# Patient Record
Sex: Male | Born: 1935 | Race: White | Hispanic: No | Marital: Married | Smoking: Never smoker
Health system: Southern US, Community
[De-identification: ages and names within clinical notes are randomized; demographics above are authoritative.]

## PROBLEM LIST (undated history)

## (undated) DIAGNOSIS — E785 Hyperlipidemia, unspecified: Secondary | ICD-10-CM

## (undated) DIAGNOSIS — E119 Type 2 diabetes mellitus without complications: Secondary | ICD-10-CM

## (undated) DIAGNOSIS — N3941 Urge incontinence: Secondary | ICD-10-CM

## (undated) DIAGNOSIS — I10 Essential (primary) hypertension: Secondary | ICD-10-CM

## (undated) DIAGNOSIS — R12 Heartburn: Secondary | ICD-10-CM

## (undated) DIAGNOSIS — E039 Hypothyroidism, unspecified: Secondary | ICD-10-CM

## (undated) DIAGNOSIS — I219 Acute myocardial infarction, unspecified: Secondary | ICD-10-CM

## (undated) DIAGNOSIS — I509 Heart failure, unspecified: Secondary | ICD-10-CM

## (undated) DIAGNOSIS — I519 Heart disease, unspecified: Secondary | ICD-10-CM

## (undated) HISTORY — PX: CATARACT EXTRACTION, BILATERAL: SHX1313

## (undated) HISTORY — DX: Heart failure, unspecified: I50.9

## (undated) HISTORY — DX: Hypothyroidism, unspecified: E03.9

## (undated) HISTORY — DX: Type 2 diabetes mellitus without complications: E11.9

## (undated) HISTORY — DX: Hyperlipidemia, unspecified: E78.5

## (undated) HISTORY — DX: Urge incontinence: N39.41

## (undated) HISTORY — DX: Acute myocardial infarction, unspecified: I21.9

## (undated) HISTORY — DX: Heartburn: R12

## (undated) HISTORY — DX: Heart disease, unspecified: I51.9

## (undated) HISTORY — DX: Essential (primary) hypertension: I10

---

## 2009-11-05 HISTORY — PX: CORONARY ARTERY BYPASS GRAFT: SHX141

## 2010-10-10 ENCOUNTER — Ambulatory Visit: Payer: Self-pay | Admitting: Internal Medicine

## 2011-06-26 ENCOUNTER — Ambulatory Visit: Payer: Self-pay | Admitting: Internal Medicine

## 2011-07-20 ENCOUNTER — Ambulatory Visit: Payer: Self-pay | Admitting: Internal Medicine

## 2011-08-16 DIAGNOSIS — Z95810 Presence of automatic (implantable) cardiac defibrillator: Secondary | ICD-10-CM | POA: Insufficient documentation

## 2011-08-23 DIAGNOSIS — I2510 Atherosclerotic heart disease of native coronary artery without angina pectoris: Secondary | ICD-10-CM | POA: Insufficient documentation

## 2011-08-23 DIAGNOSIS — E7800 Pure hypercholesterolemia, unspecified: Secondary | ICD-10-CM | POA: Insufficient documentation

## 2011-08-23 DIAGNOSIS — I472 Ventricular tachycardia, unspecified: Secondary | ICD-10-CM | POA: Insufficient documentation

## 2011-08-23 DIAGNOSIS — I10 Essential (primary) hypertension: Secondary | ICD-10-CM | POA: Insufficient documentation

## 2011-10-11 DIAGNOSIS — I509 Heart failure, unspecified: Secondary | ICD-10-CM | POA: Insufficient documentation

## 2012-01-05 ENCOUNTER — Ambulatory Visit: Payer: Self-pay | Admitting: Internal Medicine

## 2012-01-24 DIAGNOSIS — E11319 Type 2 diabetes mellitus with unspecified diabetic retinopathy without macular edema: Secondary | ICD-10-CM | POA: Insufficient documentation

## 2012-01-24 DIAGNOSIS — E119 Type 2 diabetes mellitus without complications: Secondary | ICD-10-CM | POA: Insufficient documentation

## 2012-01-24 DIAGNOSIS — I340 Nonrheumatic mitral (valve) insufficiency: Secondary | ICD-10-CM | POA: Insufficient documentation

## 2012-06-05 DIAGNOSIS — Z79899 Other long term (current) drug therapy: Secondary | ICD-10-CM | POA: Insufficient documentation

## 2012-11-05 HISTORY — PX: CARDIAC DEFIBRILLATOR PLACEMENT: SHX171

## 2015-04-28 ENCOUNTER — Other Ambulatory Visit: Payer: Self-pay | Admitting: Internal Medicine

## 2015-06-29 ENCOUNTER — Ambulatory Visit: Payer: Self-pay | Admitting: Family Medicine

## 2015-06-30 ENCOUNTER — Ambulatory Visit (INDEPENDENT_AMBULATORY_CARE_PROVIDER_SITE_OTHER): Payer: Medicare Other | Admitting: Family Medicine

## 2015-06-30 ENCOUNTER — Encounter: Payer: Self-pay | Admitting: Family Medicine

## 2015-06-30 VITALS — BP 110/60 | HR 78 | Temp 97.8°F | Ht 74.0 in | Wt 186.4 lb

## 2015-06-30 DIAGNOSIS — R600 Localized edema: Secondary | ICD-10-CM | POA: Insufficient documentation

## 2015-06-30 DIAGNOSIS — I255 Ischemic cardiomyopathy: Secondary | ICD-10-CM | POA: Insufficient documentation

## 2015-06-30 DIAGNOSIS — B9789 Other viral agents as the cause of diseases classified elsewhere: Principal | ICD-10-CM

## 2015-06-30 DIAGNOSIS — E785 Hyperlipidemia, unspecified: Secondary | ICD-10-CM | POA: Insufficient documentation

## 2015-06-30 DIAGNOSIS — F5101 Primary insomnia: Secondary | ICD-10-CM | POA: Insufficient documentation

## 2015-06-30 DIAGNOSIS — J3089 Other allergic rhinitis: Secondary | ICD-10-CM | POA: Insufficient documentation

## 2015-06-30 DIAGNOSIS — I2510 Atherosclerotic heart disease of native coronary artery without angina pectoris: Secondary | ICD-10-CM | POA: Insufficient documentation

## 2015-06-30 DIAGNOSIS — K219 Gastro-esophageal reflux disease without esophagitis: Secondary | ICD-10-CM | POA: Insufficient documentation

## 2015-06-30 DIAGNOSIS — I5032 Chronic diastolic (congestive) heart failure: Secondary | ICD-10-CM | POA: Insufficient documentation

## 2015-06-30 DIAGNOSIS — Z87898 Personal history of other specified conditions: Secondary | ICD-10-CM | POA: Insufficient documentation

## 2015-06-30 DIAGNOSIS — J069 Acute upper respiratory infection, unspecified: Secondary | ICD-10-CM | POA: Diagnosis not present

## 2015-06-30 DIAGNOSIS — E039 Hypothyroidism, unspecified: Secondary | ICD-10-CM | POA: Insufficient documentation

## 2015-07-01 ENCOUNTER — Encounter: Payer: Self-pay | Admitting: Family Medicine

## 2015-07-01 NOTE — Progress Notes (Signed)
Date:  06/30/2015   Name:  Terrance Carrillo   DOB:  [birth year]   MRN:  [id number]  PCP:  Halina Maidens, MD    Chief Complaint: Cough   History of Present Illness:  This is a 81 y.o. male with MMP including CHF c/o 3-4 day hx NP cough, sl sore throat, occ rhinorrhea, sl B otalgia. Wife with similar sxs.  Review of Systems:  Review of Systems  Constitutional: Negative for fever.  Respiratory: Negative for shortness of breath and wheezing.   Cardiovascular: Negative for chest pain.    Patient Active Problem List   Diagnosis Date Noted  . Idiopathic insomnia 06/30/2015  . Local edema 06/30/2015  . Cardiomyopathy, ischemic 06/30/2015  . History of prolonged Q-T interval on ECG 06/30/2015  . Acid reflux 06/30/2015  . Allergic state 06/30/2015  . Dyslipidemia 06/30/2015  . CAD in native artery 06/30/2015  . Chronic diastolic heart failure 76/28/3151  . Acquired hypothyroidism 06/30/2015  . MI (mitral incompetence) 01/24/2012  . Diabetes 01/24/2012  . CCF (congestive cardiac failure) 10/11/2011  . Paroxysmal ventricular tachycardia 08/23/2011  . BP (high blood pressure) 08/23/2011  . Hypercholesteremia 08/23/2011  . Arteriosclerosis of coronary artery 08/23/2011  . Automatic implantable cardioverter-defibrillator in situ 08/16/2011    Prior to Admission medications   Medication Sig Start Date End Date Taking? Authorizing Provider  aspirin 81 MG tablet Take by mouth. 11/16/10  Yes Historical Provider, MD  furosemide (LASIX) 20 MG tablet Take 1 tablet by mouth daily at 2 PM daily at 2 PM.   Yes Historical Provider, MD  glimepiride (AMARYL) 2 MG tablet Take 1 tablet by mouth daily at 2 PM daily at 2 PM.   Yes Historical Provider, MD  levothyroxine (SYNTHROID, LEVOTHROID) 25 MCG tablet Take 1 tablet by mouth daily at 2 PM daily at 2 PM.   Yes Historical Provider, MD  lisinopril (PRINIVIL,ZESTRIL) 5 MG tablet Take 1 tablet by mouth daily at 2 PM daily at 2 PM. 09/27/14 09/27/15 Yes  Historical Provider, MD  metFORMIN (GLUCOPHAGE) 1000 MG tablet Take 1 tablet by mouth 2 (two) times daily. 11/16/10  Yes Historical Provider, MD  metoprolol (LOPRESSOR) 50 MG tablet Take 1 tablet by mouth 2 (two) times daily. 01/11/15  Yes Historical Provider, MD  Multiple Vitamin (MULTI-VITAMINS) TABS Take by mouth.   Yes Historical Provider, MD  potassium chloride SA (K-DUR,KLOR-CON) 20 MEQ tablet Take 1 tablet by mouth daily at 2 PM daily at 2 PM.   Yes Historical Provider, MD  simvastatin (ZOCOR) 40 MG tablet TAKE (1) TABLET BY MOUTH DAILY AT BEDTIME 04/28/15  Yes Glean Hess, MD  sotalol (BETAPACE) 120 MG tablet Take 1 tablet by mouth 2 (two) times daily. 03/23/15  Yes Historical Provider, MD    Allergies  Allergen Reactions  . Penicillins     No past surgical history on file.  Social History  Substance Use Topics  . Smoking status: Never Smoker   . Smokeless tobacco: Not on file  . Alcohol Use: No    No family history on file.  Medication list has been reviewed and updated.  Physical Examination: BP 110/60 mmHg  Pulse 78  Temp(Src) 97.8 F (36.6 C)  Ht 6\' 2"  (1.88 m)  Wt 186 lb 6.4 oz (84.55 kg)  BMI 23.92 kg/m2  SpO2 97%  Physical Exam  Constitutional: He appears well-developed and well-nourished.  HENT:  Right Ear: External ear normal.  Left Ear: External ear normal.  Mouth/Throat: Oropharynx is  clear and moist. No oropharyngeal exudate.  B TM's clear  Pulmonary/Chest: Effort normal and breath sounds normal. He has no wheezes. He has no rales.    Assessment and Plan:  1. Viral URI with cough Rest, fluids, declined cough syrup  Return if symptoms worsen or fail to improve.  Satira Anis. [facility]  07/01/2015

## 2015-07-07 ENCOUNTER — Encounter: Payer: Self-pay | Admitting: Internal Medicine

## 2015-07-08 ENCOUNTER — Encounter: Payer: Self-pay | Admitting: Internal Medicine

## 2015-07-08 ENCOUNTER — Ambulatory Visit (INDEPENDENT_AMBULATORY_CARE_PROVIDER_SITE_OTHER): Payer: Medicare Other | Admitting: Internal Medicine

## 2015-07-08 VITALS — BP 140/64 | HR 64 | Ht 74.0 in | Wt 186.2 lb

## 2015-07-08 DIAGNOSIS — J40 Bronchitis, not specified as acute or chronic: Secondary | ICD-10-CM

## 2015-07-08 MED ORDER — CLARITHROMYCIN 500 MG PO TABS: 500.0000 mg | ORAL_TABLET | Freq: Two times a day (BID) | ORAL | Status: DC

## 2015-07-08 NOTE — Progress Notes (Signed)
Date:  07/08/2015   Name:  Terrance Carrillo   DOB:  08-Feb-1934   MRN:  [id number]   Chief Complaint: Cough  patient was seen last week, partner. He had cough productive of white thick phlegm. He denied fever or chills or significant shortness of breath. Was diagnosed with a viral syndrome and recommended to take Robitussin. He continues to have the same symptoms over the past week. His cough may be slightly looser with Robitussin. He sleeping well. He continued to have fever chills or wheezing. His wife has a similar cough and has been taking antibiotics.  Review of Systems:  Review of Systems  Constitutional: Positive for fatigue. Negative for fever and chills.  HENT: Negative for postnasal drip and sore throat.   Respiratory: Positive for cough. Negative for chest tightness, shortness of breath and wheezing.   Cardiovascular: Negative for chest pain and palpitations.    Patient Active Problem List   Diagnosis Date Noted  . Idiopathic insomnia 06/30/2015  . Local edema 06/30/2015  . Cardiomyopathy, ischemic 06/30/2015  . History of prolonged Q-T interval on ECG 06/30/2015  . Acid reflux 06/30/2015  . Allergic state 06/30/2015  . Dyslipidemia 06/30/2015  . CAD in native artery 06/30/2015  . Chronic diastolic heart failure 60/73/7106  . Acquired hypothyroidism 06/30/2015  . MI (mitral incompetence) 01/24/2012  . Diabetes mellitus without complication 26/94/8546  . CCF (congestive cardiac failure) 10/11/2011  . Paroxysmal ventricular tachycardia 08/23/2011  . BP (high blood pressure) 08/23/2011  . Hypercholesteremia 08/23/2011  . Arteriosclerosis of coronary artery 08/23/2011  . Automatic implantable cardioverter-defibrillator in situ 08/16/2011    Prior to Admission medications   Medication Sig Start Date End Date Taking? Authorizing Provider  aspirin 81 MG tablet Take by mouth. 11/16/10  Yes Historical Provider, MD  furosemide (LASIX) 20 MG tablet Take 1 tablet by mouth daily at 2  PM daily at 2 PM.   Yes Historical Provider, MD  glimepiride (AMARYL) 2 MG tablet Take 1 tablet by mouth daily at 2 PM daily at 2 PM.   Yes Historical Provider, MD  levothyroxine (SYNTHROID, LEVOTHROID) 25 MCG tablet Take 1 tablet by mouth daily at 2 PM daily at 2 PM.   Yes Historical Provider, MD  lisinopril (PRINIVIL,ZESTRIL) 5 MG tablet Take 1 tablet by mouth daily at 2 PM daily at 2 PM. 09/27/14 09/27/15 Yes Historical Provider, MD  metFORMIN (GLUCOPHAGE) 1000 MG tablet Take 1 tablet by mouth 2 (two) times daily. 11/16/10  Yes Historical Provider, MD  metoprolol (LOPRESSOR) 50 MG tablet Take 0.5 tablets by mouth 2 (two) times daily.  01/11/15  Yes Historical Provider, MD  Multiple Vitamin (MULTI-VITAMINS) TABS Take by mouth.   Yes Historical Provider, MD  potassium chloride SA (K-DUR,KLOR-CON) 20 MEQ tablet Take 1 tablet by mouth daily at 2 PM daily at 2 PM.   Yes Historical Provider, MD  simvastatin (ZOCOR) 40 MG tablet TAKE (1) TABLET BY MOUTH DAILY AT BEDTIME 04/28/15  Yes Glean Hess, MD  sotalol (BETAPACE) 120 MG tablet Take 1 tablet by mouth 2 (two) times daily. 03/23/15  Yes Historical Provider, MD    Allergies  Allergen Reactions  . Penicillins     Past Surgical History  Procedure Laterality Date  . Coronary artery bypass graft  2011    x3  . Cardiac defibrillator placement  2014    PSVT  . Cataract extraction, bilateral      Social History  Substance Use Topics  . Smoking status: Never  Smoker   . Smokeless tobacco: None  . Alcohol Use: No     Medication list has been reviewed and updated.  Physical Examination:  Physical Exam  Constitutional: He is oriented to person, place, and time. He appears well-developed. No distress.  HENT:  Head: Normocephalic and atraumatic.  Eyes: Conjunctivae are normal. Right eye exhibits no discharge. Left eye exhibits no discharge. No scleral icterus.  Neck: No tracheal tenderness present.  Pulmonary/Chest: Effort normal. No  respiratory distress. He exhibits no tenderness.  Posterior left upper lobe rhonchi are heard.  Musculoskeletal: Normal range of motion.  Lymphadenopathy:    He has no cervical adenopathy.  Neurological: He is alert and oriented to person, place, and time.  Skin: Skin is warm and dry. No rash noted.  Psychiatric: He has a normal mood and affect. His behavior is normal. Thought content normal.    BP 140/64 mmHg  Pulse 64  Ht 6\' 2"  (1.88 m)  Wt 186 lb 3.2 oz (84.46 kg)  BMI 23.90 kg/m2  Assessment and Plan: 1. Bronchitis Continue Robitussin for cough and congestion. - clarithromycin (BIAXIN) 500 MG tablet; Take 1 tablet (500 mg total) by mouth 2 (two) times daily.  Dispense: 14 tablet; Refill: 0   Halina Maidens, MD [facility]  07/08/2015

## 2015-07-27 ENCOUNTER — Other Ambulatory Visit: Payer: Self-pay | Admitting: Internal Medicine

## 2015-08-08 ENCOUNTER — Encounter: Payer: Self-pay | Admitting: Internal Medicine

## 2015-08-08 ENCOUNTER — Ambulatory Visit (INDEPENDENT_AMBULATORY_CARE_PROVIDER_SITE_OTHER): Payer: Medicare Other | Admitting: Internal Medicine

## 2015-08-08 VITALS — BP 120/60 | HR 64 | Ht 74.0 in | Wt 185.2 lb

## 2015-08-08 DIAGNOSIS — E119 Type 2 diabetes mellitus without complications: Secondary | ICD-10-CM | POA: Diagnosis not present

## 2015-08-08 DIAGNOSIS — E039 Hypothyroidism, unspecified: Secondary | ICD-10-CM | POA: Diagnosis not present

## 2015-08-08 DIAGNOSIS — S86811A Strain of other muscle(s) and tendon(s) at lower leg level, right leg, initial encounter: Secondary | ICD-10-CM | POA: Diagnosis not present

## 2015-08-08 DIAGNOSIS — S86911A Strain of unspecified muscle(s) and tendon(s) at lower leg level, right leg, initial encounter: Secondary | ICD-10-CM

## 2015-08-08 DIAGNOSIS — I5030 Unspecified diastolic (congestive) heart failure: Secondary | ICD-10-CM

## 2015-08-08 NOTE — Progress Notes (Signed)
Date:  08/08/2015   Name:  Terrance Carrillo   DOB:  [birth year]   MRN:  [id number]   Chief Complaint: Diabetes and Hypothyroidism Diabetes He presents for his follow-up diabetic visit. He has type 2 diabetes mellitus. His disease course has been stable. Pertinent negatives for hypoglycemia include no dizziness or headaches. Associated symptoms include foot paresthesias. Pertinent negatives for diabetes include no chest pain, no fatigue and no weakness. Symptoms are stable. Current diabetic treatment includes oral agent (dual therapy). There is no change in his home blood glucose trend. His breakfast blood glucose is taken between 7-8 am. His breakfast blood glucose range is generally 90-110 mg/dl. An ACE inhibitor/angiotensin II receptor blocker is being taken. Eye exam is current.  Congestive Heart Failure Presents for follow-up visit. Pertinent negatives include no abdominal pain, chest pain, claudication, fatigue, palpitations or shortness of breath. The symptoms have been stable. Past treatments include beta blockers and salt and fluid restriction. The treatment provided significant relief. Compliance with prior treatments has been good. His past medical history is significant for arrhythmia, CAD and DM.  Knee Pain  The incident occurred 3 to 5 days ago. The incident occurred at home. There was no injury mechanism. The pain is present in the right knee. The quality of the pain is described as shooting. The pain is mild. Associated symptoms include a loss of motion and numbness. The symptoms are aggravated by movement and weight bearing. He has tried heat for the symptoms. The treatment provided mild relief.   Hypothyroidism -Patient is doing well on supplementation. He denies any increased lower extremity edema, constipation, fatigue, or excessively dry skin. His activity level is stable. 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   Review of Systems:  Review of Systems  Constitutional: Negative for  fever, chills and fatigue.  HENT: Negative for tinnitus and trouble swallowing.   Eyes: Negative for visual disturbance.  Respiratory: Negative for chest tightness, shortness of breath and wheezing.   Cardiovascular: Negative for chest pain, palpitations, claudication and leg swelling.  Gastrointestinal: Negative for nausea, abdominal pain and diarrhea.  Genitourinary: Negative for difficulty urinating.  Musculoskeletal: Positive for arthralgias. Negative for back pain, joint swelling and gait problem.  Neurological: Positive for numbness. Negative for dizziness, weakness and headaches.  Psychiatric/Behavioral: Negative for sleep disturbance and dysphoric mood.    Patient Active Problem List   Diagnosis Date Noted  . Idiopathic insomnia 06/30/2015  . Local edema 06/30/2015  . Cardiomyopathy, ischemic 06/30/2015  . History of prolonged Q-T interval on ECG 06/30/2015  . Acid reflux 06/30/2015  . Allergic state 06/30/2015  . Dyslipidemia 06/30/2015  . CAD in native artery 06/30/2015  . Chronic diastolic heart failure ([place]) 06/30/2015  . Acquired hypothyroidism 06/30/2015  . Other long term (current) drug therapy 06/05/2012  . MI (mitral incompetence) 01/24/2012  . Diabetes mellitus without complication ([place]) 25/95/6387  . CCF (congestive cardiac failure) ([place]) 10/11/2011  . Paroxysmal ventricular tachycardia ([place]) 08/23/2011  . BP (high blood pressure) 08/23/2011  . Hypercholesteremia 08/23/2011  . Arteriosclerosis of coronary artery 08/23/2011  . Automatic implantable cardioverter-defibrillator in situ 08/16/2011    Prior to Admission medications   Medication Sig Start Date End Date Taking? Authorizing Provider  aspirin 81 MG tablet Take by mouth. 11/16/10  Yes Historical Provider, MD  glimepiride (AMARYL) 2 MG tablet TAKE ONE (1) TABLET BY MOUTH ONCE DAILY 07/27/15  Yes Glean Hess, MD  levothyroxine (SYNTHROID, LEVOTHROID) 25 MCG tablet Take 1 tablet by mouth  daily at 2 PM  daily at 2 PM.   Yes Historical Provider, MD  lisinopril (PRINIVIL,ZESTRIL) 5 MG tablet Take 1 tablet by mouth daily at 2 PM daily at 2 PM. 09/27/14 09/27/15 Yes Historical Provider, MD  metFORMIN (GLUCOPHAGE) 1000 MG tablet TAKE (1) TABLET BY MOUTH TWICE DAILY 07/27/15  Yes Glean Hess, MD  metoprolol (LOPRESSOR) 50 MG tablet Take 0.5 tablets by mouth 2 (two) times daily.  01/11/15  Yes Historical Provider, MD  Multiple Vitamin (MULTI-VITAMINS) TABS Take by mouth.   Yes Historical Provider, MD  potassium chloride SA (K-DUR,KLOR-CON) 20 MEQ tablet Take 1 tablet by mouth daily at 2 PM daily at 2 PM.   Yes Historical Provider, MD  simvastatin (ZOCOR) 40 MG tablet TAKE (1) TABLET BY MOUTH DAILY AT BEDTIME 07/27/15  Yes Glean Hess, MD  sotalol (BETAPACE) 120 MG tablet Take 1 tablet by mouth 2 (two) times daily. 03/23/15  Yes Historical Provider, MD  furosemide (LASIX) 20 MG tablet TAKE (1) TABLET BY MOUTH EVERY DAY 07/27/15   Glean Hess, MD    Allergies  Allergen Reactions  . Penicillins     Past Surgical History  Procedure Laterality Date  . Coronary artery bypass graft  2011    x3  . Cardiac defibrillator placement  2014    PSVT  . Cataract extraction, bilateral      Social History  Substance Use Topics  . Smoking status: Never Smoker   . Smokeless tobacco: None  . Alcohol Use: No     Medication list has been reviewed and updated.  Physical Examination:  Physical Exam  Constitutional: He is oriented to person, place, and time. He appears well-developed.  Neck: Normal range of motion. Neck supple. No thyroid mass and no thyromegaly present.  Cardiovascular: Normal rate and regular rhythm.   No murmur heard. Pulmonary/Chest: Effort normal and breath sounds normal.  Musculoskeletal:       Right knee: He exhibits no swelling and no effusion. Tenderness found. Lateral joint line tenderness noted.  Lymphadenopathy:    He has no cervical adenopathy.  Neurological: He  is alert and oriented to person, place, and time.  Skin: Skin is warm, dry and intact.  Psychiatric: He has a normal mood and affect. His speech is normal. Cognition and memory are normal.  Nursing note and vitals reviewed.   BP 120/60 mmHg  Pulse 64  Ht 6\' 2"  (1.88 m)  Wt 185 lb 3.2 oz (84.006 kg)  BMI 23.77 kg/m2  Assessment and Plan: 1. Diabetes mellitus without complication ([place]) Fair control on oral medication; no changes today - Basic metabolic panel - Hemoglobin A1c  2. Acquired hypothyroidism Supplemented; adjust dose as needed - TSH  3. Diastolic congestive heart failure, unspecified congestive heart failure chronicity (HCC) Currently stable on medical regimen Follow-up with cardiology as planned - Basic metabolic panel  4. Knee strain, right, initial encounter Mild strain of the right knee noted Continue heat topical rubs and Tylenol as needed Return or refer Ortho if symptoms worsen  Halina Maidens, MD [facility]  08/08/2015

## 2015-08-09 LAB — BASIC METABOLIC PANEL
BUN / CREAT RATIO: 17 (ref 10–22)
BUN: 20 mg/dL (ref 8–27)
CALCIUM: 10 mg/dL (ref 8.6–10.2)
CHLORIDE: 101 mmol/L (ref 97–108)
CO2: 27 mmol/L (ref 18–29)
CREATININE: 1.16 mg/dL (ref 0.76–1.27)
GFR, EST AFRICAN AMERICAN: 69 mL/min/{1.73_m2} (ref >59)
GFR, EST NON AFRICAN AMERICAN: 60 mL/min/{1.73_m2} (ref >59)
Glucose: 115 mg/dL — ABNORMAL HIGH (ref 65–99)
Potassium: 4.8 mmol/L (ref 3.5–5.2)
Sodium: 144 mmol/L (ref 134–144)

## 2015-08-09 LAB — HEMOGLOBIN A1C
ESTIMATED AVERAGE GLUCOSE: 169 mg/dL
HEMOGLOBIN A1C: 7.5 % — AB (ref 4.8–5.6)

## 2015-08-09 LAB — TSH: TSH: 3.16 u[IU]/mL (ref 0.450–4.500)

## 2015-12-09 ENCOUNTER — Ambulatory Visit (INDEPENDENT_AMBULATORY_CARE_PROVIDER_SITE_OTHER): Payer: Medicare Other | Admitting: Internal Medicine

## 2015-12-09 ENCOUNTER — Encounter: Payer: Self-pay | Admitting: Internal Medicine

## 2015-12-09 VITALS — BP 128/60 | HR 64 | Ht 74.0 in | Wt 184.8 lb

## 2015-12-09 DIAGNOSIS — I5032 Chronic diastolic (congestive) heart failure: Secondary | ICD-10-CM | POA: Diagnosis not present

## 2015-12-09 DIAGNOSIS — E119 Type 2 diabetes mellitus without complications: Secondary | ICD-10-CM

## 2015-12-09 DIAGNOSIS — K589 Irritable bowel syndrome without diarrhea: Secondary | ICD-10-CM

## 2015-12-09 DIAGNOSIS — I10 Essential (primary) hypertension: Secondary | ICD-10-CM

## 2015-12-09 DIAGNOSIS — N3000 Acute cystitis without hematuria: Secondary | ICD-10-CM | POA: Diagnosis not present

## 2015-12-09 DIAGNOSIS — K449 Diaphragmatic hernia without obstruction or gangrene: Secondary | ICD-10-CM

## 2015-12-09 LAB — POC URINALYSIS WITH MICROSCOPIC (NON AUTO)MANUAL RESULT
Crystals: 0
Epithelial cells, urine per micros: 0
GLUCOSE UA: NEGATIVE
KETONES UA: NEGATIVE
Mucus, UA: 0
NITRITE UA: POSITIVE
PH UA: 7.5
Protein, UA: 30
RBC: 2 M/uL — AB (ref 4.69–6.13)
Spec Grav, UA: 1.01
UROBILINOGEN UA: 0.2
WBC CASTS UA: 5

## 2015-12-09 MED ORDER — DICYCLOMINE HCL 10 MG PO CAPS: 10.0000 mg | ORAL_CAPSULE | Freq: Three times a day (TID) | ORAL | Status: DC

## 2015-12-09 NOTE — Progress Notes (Signed)
Date:  12/09/2015   Name:  Terrance Carrillo   DOB:  06-20-1934   MRN:  [id number]   Chief Complaint: Follow-up; Diabetes; Hypertension; and Hypothyroidism  Diabetes He presents for his follow-up diabetic visit. He has type 2 diabetes mellitus. His disease course has been stable. Pertinent negatives for hypoglycemia include no dizziness, headaches, nervousness/anxiousness or tremors. Associated symptoms include foot paresthesias. Pertinent negatives for diabetes include no blurred vision, no chest pain, no fatigue, no polydipsia and no polyuria. Symptoms are stable. Diabetic complications include a CVA. His breakfast blood glucose is taken between 6-7 am. His breakfast blood glucose range is generally 90-110 mg/dl. An ACE inhibitor/angiotensin II receptor blocker is being taken. Eye exam is current.  Hypertension This is a chronic problem. The current episode started more than 1 year ago. The problem is unchanged. The problem is controlled. Pertinent negatives include no blurred vision, chest pain, headaches or palpitations. Hypertensive end-organ damage includes CAD/MI, CVA and heart failure.  Dysuria  This is a recurrent problem. The current episode started in the past 7 days. The problem occurs every urination. The problem has been unchanged. The quality of the pain is described as burning (and odor). There is no history of pyelonephritis. Associated symptoms include frequency. Pertinent negatives include no hematuria, nausea or vomiting.   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  He also has flatulence after eating or drinking.  If he has a bowel movement, he may feel a bit better.  Sitting still reduces the discomfort as does fasting. He only has a bowel movement every 3-4 days but takes no stool softener or laxative. He reports a history of hiatal hernia noted many years ago.  CAD with chronic CHF - patient states that symptoms are stable.  No chest pain  or increase in SOB.  No lower extremity edema, PND or orthopnea.  No medication side effects.  Review of Systems  Constitutional: Negative for fatigue.  HENT: Negative for hearing loss.   Eyes: Negative for blurred vision and visual disturbance.  Respiratory: Negative for chest tightness and wheezing.   Cardiovascular: Negative for chest pain and palpitations.  Gastrointestinal: Positive for abdominal pain (occasional feeling of food sticking in his lower esophagus) and constipation. Negative for nausea, vomiting, diarrhea and blood in stool.  Endocrine: Negative for polydipsia and polyuria.  Genitourinary: Positive for dysuria and frequency. Negative for hematuria and difficulty urinating.  Neurological: Negative for dizziness, tremors, numbness and headaches.  Psychiatric/Behavioral: Negative for sleep disturbance and dysphoric mood. The patient is not nervous/anxious.     Patient Active Problem List   Diagnosis Date Noted  . Idiopathic insomnia 06/30/2015  . Local edema 06/30/2015  . Cardiomyopathy, ischemic 06/30/2015  . History of prolonged Q-T interval on ECG 06/30/2015  . Acid reflux 06/30/2015  . Allergic state 06/30/2015  . Dyslipidemia 06/30/2015  . Chronic diastolic heart failure ([place]) 06/30/2015  . Acquired hypothyroidism 06/30/2015  . Other long term (current) drug therapy 06/05/2012  . MI (mitral incompetence) 01/24/2012  . Diabetes mellitus without complication ([place]) [id number]  . CCF (congestive cardiac failure) ([place]) 10/11/2011  . Paroxysmal ventricular tachycardia ([place]) 08/23/2011  . BP (high blood pressure) 08/23/2011  . Arteriosclerosis of coronary artery 08/23/2011  . Automatic implantable cardioverter-defibrillator in situ 08/16/2011    Prior to Admission medications   Medication Sig Start Date End Date Taking? Authorizing Provider  aspirin 81 MG tablet Take by mouth. 11/16/10  Yes Historical Provider, MD  furosemide (LASIX) 20 MG tablet TAKE (1) TABLET BY  MOUTH EVERY DAY 07/27/15  Yes Glean Hess, MD  glimepiride (AMARYL) 2 MG tablet TAKE ONE (1) TABLET BY MOUTH ONCE DAILY 07/27/15  Yes Glean Hess, MD  levothyroxine (SYNTHROID, LEVOTHROID) 25 MCG tablet Take 1 tablet by mouth daily at 2 PM daily at 2 PM.   Yes Historical Provider, MD  lisinopril (PRINIVIL,ZESTRIL) 5 MG tablet Take by mouth daily. 08/29/15  Yes Historical Provider, MD  metFORMIN (GLUCOPHAGE) 1000 MG tablet TAKE (1) TABLET BY MOUTH TWICE DAILY 07/27/15  Yes Glean Hess, MD  metoprolol (LOPRESSOR) 50 MG tablet Take 0.5 tablets by mouth 2 (two) times daily.  01/11/15  Yes Historical Provider, MD  Multiple Vitamin (MULTI-VITAMINS) TABS Take by mouth.   Yes Historical Provider, MD  potassium chloride SA (K-DUR,KLOR-CON) 20 MEQ tablet Take 1 tablet by mouth daily at 2 PM daily at 2 PM.   Yes Historical Provider, MD  simvastatin (ZOCOR) 40 MG tablet TAKE (1) TABLET BY MOUTH DAILY AT BEDTIME 07/27/15  Yes Glean Hess, MD  sotalol (BETAPACE) 120 MG tablet Take 1 tablet by mouth 2 (two) times daily. 03/23/15  Yes Historical Provider, MD    Allergies  Allergen Reactions  . Penicillins     Past Surgical History  Procedure Laterality Date  . Coronary artery bypass graft  2011    x3  . Cardiac defibrillator placement  2014    PSVT  . Cataract extraction, bilateral      Social History  Substance Use Topics  . Smoking status: Never Smoker   . Smokeless tobacco: None  . Alcohol Use: No   Diabetic Foot Exam - Simple   Simple Foot Form  Diabetic Foot exam was performed with the following findings:  Yes 12/09/2015  9:25 AM  Visual Inspection  No deformities, no ulcerations, no other skin breakdown bilaterally:  Yes  Sensation Testing  Intact to touch and monofilament testing bilaterally:  Yes  Pulse Check  Posterior Tibialis and Dorsalis pulse intact bilaterally:  Yes  Comments     Depression screen PHQ 2/9 07/08/2015  Decreased Interest 0  Down, Depressed, Hopeless  0  PHQ - 2 Score 0    Medication list has been reviewed and updated.   Physical Exam  Constitutional: He is oriented to person, place, and time. He appears well-developed. No distress.  HENT:  Head: Normocephalic and atraumatic.  Neck: Normal range of motion. No thyromegaly present.  Cardiovascular: Normal rate, regular rhythm and normal heart sounds.   Pulmonary/Chest: Effort normal and breath sounds normal. No respiratory distress.  Abdominal: Soft. Bowel sounds are normal. He exhibits no mass. There is no hepatosplenomegaly. There is tenderness in the epigastric area. There is no rebound, no guarding and no CVA tenderness.  Musculoskeletal: Normal range of motion. He exhibits no edema or tenderness.  Neurological: He is alert and oriented to person, place, and time. No sensory deficit.  Skin: Skin is warm and dry. No rash noted.  Psychiatric: He has a normal mood and affect. His behavior is normal. Thought content normal.  Nursing note and vitals reviewed.   BP 128/60 mmHg  Pulse 64  Ht 6\' 2"  (1.88 m)  Wt 184 lb 12.8 oz (83.825 kg)  BMI 23.72 kg/m2  Assessment and Plan: 1. Diabetes mellitus without complication ([place]) Continue current medication Encourage FSBS several times per week - Hemoglobin A1c  2. Hiatal hernia Moderately symptomatic - avoidance of exacerbating activities recommended  3. Irritable bowel syndrome (IBS) Will try bentyl before meals If no improvement, will refer to GI - dicyclomine (BENTYL) 10 MG capsule; Take 1 capsule (10 mg total) by mouth 4 (four) times daily -  before meals and at bedtime.  Dispense: 90 capsule; Refill: 1  4. Acute cystitis without hematuria Will wait for culture before sending antibiotics - POC urinalysis w microscopic (non auto) - Urine Culture  5. Essential hypertension controlled  6. Chronic diastolic heart failure ([place]) Stable on current regimen Follow up with Cardiology in the near future   Halina Maidens,  MD [facility]  12/09/2015

## 2015-12-10 LAB — HEMOGLOBIN A1C
ESTIMATED AVERAGE GLUCOSE: 160 mg/dL
Hgb A1c MFr Bld: 7.2 % — ABNORMAL HIGH (ref 4.8–5.6)

## 2015-12-11 ENCOUNTER — Other Ambulatory Visit: Payer: Self-pay | Admitting: Internal Medicine

## 2015-12-11 LAB — URINE CULTURE

## 2015-12-11 MED ORDER — NITROFURANTOIN MONOHYD MACRO 100 MG PO CAPS: 100.0000 mg | ORAL_CAPSULE | Freq: Two times a day (BID) | ORAL | Status: DC

## 2015-12-12 ENCOUNTER — Telehealth: Payer: Self-pay

## 2015-12-12 NOTE — Telephone Encounter (Signed)
Left message for patient to call back  

## 2015-12-12 NOTE — Telephone Encounter (Signed)
-----   Message from Glean Hess, MD sent at 12/11/2015  3:52 PM EST ----- Urine culture shows definite infection.  I will send antibiotics to the pharmacy.  Please make a follow up appt for a recheck after the antibiotics are complete.

## 2015-12-12 NOTE — Telephone Encounter (Signed)
-----   Message from Glean Hess, MD sent at 12/10/2015  3:48 PM EST ----- DM is good.  Continue same medication.

## 2015-12-13 NOTE — Telephone Encounter (Signed)
Spoke with patient. Patient advised of all results and verbalized understanding. Will call back with any future questions or concerns. MAH  

## 2015-12-26 ENCOUNTER — Encounter: Payer: Self-pay | Admitting: Internal Medicine

## 2015-12-26 ENCOUNTER — Ambulatory Visit (INDEPENDENT_AMBULATORY_CARE_PROVIDER_SITE_OTHER): Payer: Medicare Other | Admitting: Internal Medicine

## 2015-12-26 VITALS — BP 108/56 | HR 64 | Ht 74.0 in | Wt 184.0 lb

## 2015-12-26 DIAGNOSIS — K589 Irritable bowel syndrome without diarrhea: Secondary | ICD-10-CM

## 2015-12-26 DIAGNOSIS — N390 Urinary tract infection, site not specified: Secondary | ICD-10-CM

## 2015-12-26 LAB — POC URINALYSIS WITH MICROSCOPIC (NON AUTO)MANUAL RESULT
BILIRUBIN UA: NEGATIVE
Blood, UA: NEGATIVE
Crystals: 0
EPITHELIAL CELLS, URINE PER MICROSCOPY: 0
Glucose, UA: NEGATIVE
KETONES UA: NEGATIVE
MUCUS UA: 0
NITRITE UA: NEGATIVE
PH UA: 7
PROTEIN UA: NEGATIVE
RBC: 0 M/uL — AB (ref 4.69–6.13)
SPEC GRAV UA: 1.01
UROBILINOGEN UA: 0.2
WBC CASTS UA: 5

## 2015-12-26 NOTE — Progress Notes (Signed)
Date:  12/26/2015   Name:  Terrance Carrillo   DOB:  February 03, 1934   MRN:  [id number]   Chief Complaint: Follow-up and Urinary Tract Infection Urinary Tract Infection  This is a recurrent problem. The current episode started in the past 7 days. The problem occurs every urination. The problem has been gradually improving. The patient is experiencing no pain. The maximum temperature recorded prior to his arrival was more than 104 F. Associated symptoms include frequency. Pertinent negatives include no chills, discharge, hematuria or urgency. He has tried increased fluids and antibiotics for the symptoms.   IBS - patient was prescribed bentyl last visit.  He believes that it has helped.   Review of Systems  Constitutional: Negative for chills, appetite change, fatigue and unexpected weight change.  Respiratory: Negative for cough and shortness of breath.   Cardiovascular: Negative for chest pain and palpitations.  Gastrointestinal: Negative for abdominal pain and blood in stool.  Endocrine: Negative for polydipsia and polyuria.  Genitourinary: Positive for frequency. Negative for dysuria, urgency and hematuria.  Skin: Negative for color change and rash.    Patient Active Problem List   Diagnosis Date Noted  . Hiatal hernia 12/09/2015  . Irritable bowel syndrome (IBS) 12/09/2015  . Essential hypertension 12/09/2015  . Idiopathic insomnia 06/30/2015  . Local edema 06/30/2015  . Cardiomyopathy, ischemic 06/30/2015  . History of prolonged Q-T interval on ECG 06/30/2015  . Acid reflux 06/30/2015  . Allergic state 06/30/2015  . Dyslipidemia 06/30/2015  . Chronic diastolic heart failure ([place]) 06/30/2015  . Acquired hypothyroidism 06/30/2015  . Other long term (current) drug therapy 06/05/2012  . MI (mitral incompetence) 01/24/2012  . Diabetes mellitus without complication ([place]) [id number]  . CCF (congestive cardiac failure) ([place]) 10/11/2011  . Paroxysmal ventricular tachycardia ([place])  08/23/2011  . Arteriosclerosis of coronary artery 08/23/2011  . Automatic implantable cardioverter-defibrillator in situ 08/16/2011    Prior to Admission medications   Medication Sig Start Date End Date Taking? Authorizing Provider  aspirin 81 MG tablet Take by mouth. 11/16/10  Yes Historical Provider, MD  dicyclomine (BENTYL) 10 MG capsule Take 1 capsule (10 mg total) by mouth 4 (four) times daily -  before meals and at bedtime. 12/09/15  Yes Glean Hess, MD  furosemide (LASIX) 20 MG tablet TAKE (1) TABLET BY MOUTH EVERY DAY 07/27/15  Yes Glean Hess, MD  glimepiride (AMARYL) 2 MG tablet TAKE ONE (1) TABLET BY MOUTH ONCE DAILY 07/27/15  Yes Glean Hess, MD  levothyroxine (SYNTHROID, LEVOTHROID) 25 MCG tablet Take 1 tablet by mouth daily at 2 PM daily at 2 PM.   Yes Historical Provider, MD  lisinopril (PRINIVIL,ZESTRIL) 5 MG tablet Take by mouth daily. 08/29/15  Yes Historical Provider, MD  metFORMIN (GLUCOPHAGE) 1000 MG tablet TAKE (1) TABLET BY MOUTH TWICE DAILY 07/27/15  Yes Glean Hess, MD  metoprolol (LOPRESSOR) 50 MG tablet Take 0.5 tablets by mouth 2 (two) times daily.  01/11/15  Yes Historical Provider, MD  Multiple Vitamin (MULTI-VITAMINS) TABS Take by mouth.   Yes Historical Provider, MD  potassium chloride SA (K-DUR,KLOR-CON) 20 MEQ tablet Take 1 tablet by mouth daily at 2 PM daily at 2 PM.   Yes Historical Provider, MD  simvastatin (ZOCOR) 40 MG tablet TAKE (1) TABLET BY MOUTH DAILY AT BEDTIME 07/27/15  Yes Glean Hess, MD  sotalol (BETAPACE) 120 MG tablet Take 1 tablet by mouth 2 (two) times daily. 03/23/15  Yes Historical Provider, MD  Allergies  Allergen Reactions  . Penicillins     Past Surgical History  Procedure Laterality Date  . Coronary artery bypass graft  2011    x3  . Cardiac defibrillator placement  2014    PSVT  . Cataract extraction, bilateral      Social History  Substance Use Topics  . Smoking status: Never Smoker   . Smokeless  tobacco: None  . Alcohol Use: No     Medication list has been reviewed and updated.   Physical Exam  Constitutional: He is oriented to person, place, and time. He appears well-developed. No distress.  HENT:  Head: Normocephalic and atraumatic.  Pulmonary/Chest: Effort normal. No respiratory distress.  Musculoskeletal: Normal range of motion.  Neurological: He is alert and oriented to person, place, and time.  Skin: Skin is warm and dry. No rash noted.  Psychiatric: He has a normal mood and affect. His behavior is normal. Thought content normal.    BP 108/56 mmHg  Pulse 64  Ht 6\' 2"  (1.88 m)  Wt 184 lb (83.462 kg)  BMI 23.61 kg/m2  Assessment and Plan: 1. Urinary tract infection without hematuria, site unspecified Will send for culture and refer to Urology due to persistent positive UA - Ambulatory referral to Urology - Urine Culture  2. Irritable bowel syndrome (IBS) Improved - continue Bentyl   Halina Maidens, MD [facility]  12/26/2015

## 2015-12-28 ENCOUNTER — Other Ambulatory Visit: Payer: Self-pay | Admitting: Internal Medicine

## 2015-12-28 LAB — URINE CULTURE

## 2015-12-28 LAB — PLEASE NOTE

## 2015-12-29 ENCOUNTER — Telehealth: Payer: Self-pay

## 2015-12-29 ENCOUNTER — Other Ambulatory Visit: Payer: Self-pay | Admitting: Internal Medicine

## 2015-12-29 MED ORDER — CIPROFLOXACIN HCL 250 MG PO TABS: 250.0000 mg | ORAL_TABLET | Freq: Two times a day (BID) | ORAL | Status: DC

## 2015-12-29 NOTE — Telephone Encounter (Signed)
-----   Message from Glean Hess, MD sent at 12/29/2015  8:33 AM EST ----- Culture shows infection with a single type of bacteria.  Start antibiotics - sent to pharmacy.

## 2015-12-29 NOTE — Telephone Encounter (Signed)
Left message for patient to call back  

## 2015-12-30 NOTE — Telephone Encounter (Signed)
Spoke with patient. Patient advised of all results and verbalized understanding. Will call back with any future questions or concerns. MAH  

## 2016-01-02 ENCOUNTER — Ambulatory Visit (INDEPENDENT_AMBULATORY_CARE_PROVIDER_SITE_OTHER): Payer: Medicare Other | Admitting: Urology

## 2016-01-02 ENCOUNTER — Encounter: Payer: Self-pay | Admitting: Urology

## 2016-01-02 VITALS — BP 156/73 | HR 63 | Ht 74.0 in | Wt 186.8 lb

## 2016-01-02 DIAGNOSIS — N138 Other obstructive and reflux uropathy: Secondary | ICD-10-CM

## 2016-01-02 DIAGNOSIS — N401 Enlarged prostate with lower urinary tract symptoms: Secondary | ICD-10-CM | POA: Insufficient documentation

## 2016-01-02 DIAGNOSIS — N390 Urinary tract infection, site not specified: Secondary | ICD-10-CM

## 2016-01-02 LAB — MICROSCOPIC EXAMINATION: Bacteria, UA: NONE SEEN

## 2016-01-02 LAB — URINALYSIS, COMPLETE
Bilirubin, UA: NEGATIVE
Glucose, UA: NEGATIVE
Ketones, UA: NEGATIVE
LEUKOCYTES UA: NEGATIVE
Nitrite, UA: NEGATIVE
PH UA: 6.5 (ref 5.0–7.5)
RBC, UA: NEGATIVE
Specific Gravity, UA: 1.02 (ref 1.005–1.030)
Urobilinogen, Ur: 0.2 mg/dL (ref 0.2–1.0)

## 2016-01-02 LAB — BLADDER SCAN AMB NON-IMAGING: Scan Result: 27

## 2016-01-02 NOTE — Progress Notes (Signed)
01/02/2016 10:54 AM   Terrance Carrillo 1934/03/21 [id number]  Referring provider: Glean Hess, MD [address] [place], [place] [address]  Chief Complaint  Patient presents with  . Urge Incontinence    referred by Dr. Aida Puffer    HPI: Patient is a 82 year old Caucasian male who presents to [place] have requested by his primary care provider Dr. Army Melia for a persistent positive UA.  Patient is a poor historian. He states that he doesn't understand why he is being seen by urology.  He stated he went and saw Dr. Army Melia  for odor in his urine.  He did have a recorded temperature 104F, but the patient denies this finding.  He denies any dysuria, gross hematuria or suprapubic pain.  He denies any flank pain. He denies fevers, chills, nausea or vomiting.   Patient had a positive urinary tract infection for Klebsiella pneumonia on 12/09/2015.   He also is positive again for Klebsiella pneumonia on 12/26/2015.  His UA today is unremarkable, but he is currently on an antibiotic.  He does have a prior history of nephrolithiasis several years ago.  He did pass that stone spontaneously.    He has baseline urinary symptoms of feeling of incomplete emptying, frequency, not dysuria and a weak urinary stream. His I PSS score is 9/1. His PVR today is 27 mL.       IPSS      01/02/16 1000       International Prostate Symptom Score   How often have you had the sensation of not emptying your bladder? Less than 1 in 5     How often have you had to urinate less than every two hours? Less than 1 in 5 times     How often have you found you stopped and started again several times when you urinated? Not at All     How often have you found it difficult to postpone urination? Not at All     How often have you had a weak urinary stream? Less than half the time     How often have you had to strain to start urination? Not at All     How many times did you typically get up at night to  urinate? 5 Times     Total IPSS Score 9     Quality of Life due to urinary symptoms   If you were to spend the rest of your life with your urinary condition just the way it is now how would you feel about that? Pleased        Score:  1-7 Mild 8-19 Moderate 20-35 Severe   PMH: Past Medical History  Diagnosis Date  . Heartburn   . Diabetes ([place])   . Heart attack ([place])   . Heart failure ([place])   . Heart disease   . HLD (hyperlipidemia)   . HTN (hypertension)   . Hypothyroidism   . Urge incontinence     Surgical History: Past Surgical History  Procedure Laterality Date  . Coronary artery bypass graft  2011    x3  . Cardiac defibrillator placement  2014    PSVT  . Cataract extraction, bilateral      Home Medications:    Medication List       This list is accurate as of: 01/02/16 10:54 AM.  Always use your most recent med list.  aspirin 81 MG tablet  Take by mouth.     ciprofloxacin 250 MG tablet  Commonly known as:  CIPRO  Take 1 tablet (250 mg total) by mouth 2 (two) times daily.     dicyclomine 10 MG capsule  Commonly known as:  BENTYL  Take 1 capsule (10 mg total) by mouth 4 (four) times daily -  before meals and at bedtime.     furosemide 20 MG tablet  Commonly known as:  LASIX  TAKE (1) TABLET BY MOUTH EVERY DAY     glimepiride 2 MG tablet  Commonly known as:  AMARYL  TAKE ONE (1) TABLET BY MOUTH ONCE DAILY     levothyroxine 25 MCG tablet  Commonly known as:  SYNTHROID, LEVOTHROID  TAKE 1 TABLET ON AN EMPTY STOMACH WITH A GLASS OF WATER AT LEAST 30 TO 60 MINUTES BEFORE BREAKFAST.     lisinopril 5 MG tablet  Commonly known as:  PRINIVIL,ZESTRIL  Take by mouth daily.     magnesium oxide 400 MG tablet  Commonly known as:  MAG-OX  Take by mouth.     metFORMIN 1000 MG tablet  Commonly known as:  GLUCOPHAGE  TAKE (1) TABLET BY MOUTH TWICE DAILY     metoprolol 50 MG tablet  Commonly known as:  LOPRESSOR  Take 0.5 tablets by  mouth 2 (two) times daily.     MULTI-VITAMINS Tabs  Take by mouth.     potassium chloride SA 20 MEQ tablet  Commonly known as:  K-DUR,KLOR-CON  Take 1 tablet by mouth daily at 2 PM daily at 2 PM.     simvastatin 40 MG tablet  Commonly known as:  ZOCOR  TAKE (1) TABLET BY MOUTH DAILY AT BEDTIME     sotalol 120 MG tablet  Commonly known as:  BETAPACE  Take 1 tablet by mouth 2 (two) times daily.        Allergies:  Allergies  Allergen Reactions  . Penicillins     Family History: Family History  Problem Relation Age of Onset  . Melanoma Father   . Kidney disease Neg Hx   . Prostate cancer Neg Hx     Social History:  reports that he has never smoked. He does not have any smokeless tobacco history on file. He reports that he does not drink alcohol or use illicit drugs.  ROS: UROLOGY Frequent Urination?: No Hard to postpone urination?: No Burning/pain with urination?: No Get up at night to urinate?: Yes Leakage of urine?: No Urine stream starts and stops?: No Trouble starting stream?: Yes Do you have to strain to urinate?: No Blood in urine?: No Urinary tract infection?: Yes Sexually transmitted disease?: No Injury to kidneys or bladder?: No Painful intercourse?: No Weak stream?: No Erection problems?: No Penile pain?: No  Gastrointestinal Nausea?: No Vomiting?: No Indigestion/heartburn?: Yes Diarrhea?: No Constipation?: No  Constitutional Fever: No Night sweats?: No Weight loss?: No Fatigue?: No  Skin Skin rash/lesions?: No Itching?: No  Eyes Blurred vision?: No Double vision?: No  Ears/Nose/Throat Sore throat?: No Sinus problems?: No  Hematologic/Lymphatic Swollen glands?: No Easy bruising?: Yes  Cardiovascular Leg swelling?: No Chest pain?: No  Respiratory Cough?: No Shortness of breath?: No  Endocrine Excessive thirst?: No  Musculoskeletal Back pain?: No Joint pain?: No  Neurological Headaches?: No Dizziness?:  No  Psychologic Depression?: No Anxiety?: No  Physical Exam: BP 156/73 mmHg  Pulse 63  Ht 6\' 2"  (1.88 m)  Wt 186 lb 12.8 oz (84.732 kg)  BMI 23.97 kg/m2  Constitutional: Well nourished. Alert and oriented, No acute distress. HEENT: [place] AT, moist mucus membranes. Trachea midline, no masses. Cardiovascular: No clubbing, cyanosis, or edema. Respiratory: Normal respiratory effort, no increased work of breathing. GI: Abdomen is soft, non tender, non distended, no abdominal masses. Liver and spleen not palpable.  No hernias appreciated.  Stool sample for occult testing is not indicated.   GU: No CVA tenderness.  No bladder fullness or masses.  Patient with circumcised phallus.   Urethral meatus is patent.  No penile discharge. No penile lesions or rashes. Scrotum without lesions, cysts, rashes and/or edema.  Testicles are located scrotally bilaterally. No masses are appreciated in the testicles. Left and right epididymis are normal. Rectal: Patient with  normal sphincter tone. Anus and perineum without scarring or rashes. No rectal masses are appreciated. Prostate is approximately 50 grams, no nodules are appreciated. Seminal vesicles are normal. Skin: No rashes, bruises or suspicious lesions. Lymph: No cervical or inguinal adenopathy. Neurologic: Grossly intact, no focal deficits, moving all 4 extremities. Psychiatric: Normal mood and affect.  Laboratory Data:   Lab Results  Component Value Date   CREATININE 1.16 08/08/2015     Lab Results  Component Value Date   HGBA1C 7.2* 12/09/2015    Lab Results  Component Value Date   TSH 3.160 08/08/2015      Urinalysis Results for orders placed or performed in visit on 01/02/16  Microscopic Examination  Result Value Ref Range   WBC, UA 0-5 0 -  5 /hpf   RBC, UA 0-2 0 -  2 /hpf   Epithelial Cells (non renal) 0-10 0 - 10 /hpf   Bacteria, UA None seen None seen/Few  Urinalysis, Complete  Result Value Ref Range   Specific  Gravity, UA 1.020 1.005 - 1.030   pH, UA 6.5 5.0 - 7.5   Color, UA Yellow Yellow   Appearance Ur Clear Clear   Leukocytes, UA Negative Negative   Protein, UA Trace (A) Negative/Trace   Glucose, UA Negative Negative   Ketones, UA Negative Negative   RBC, UA Negative Negative   Bilirubin, UA Negative Negative   Urobilinogen, Ur 0.2 0.2 - 1.0 mg/dL   Nitrite, UA Negative Negative   Microscopic Examination See below:   BLADDER SCAN AMB NON-IMAGING  Result Value Ref Range   Scan Result 27     Pertinent Imaging: Results for Terrance, Carrillo (MRN [id number]) as of 01/02/2016 10:45  Ref. Range 01/02/2016 10:35  Scan Result Unknown 27    Assessment & Plan:    1. UTI:    Patient had a positive urinary tract infection for Klebsiella pneumonia on 12/09/2015.   He also is positive again for Klebsiella pneumonia on 12/26/2015.   He also has a recorded high temperature was associated with this infection.   We'll schedule a renal ultrasound and KUB to rule out nephrolithiasis as a nidus for his infections.  - Urinalysis, Complete  2. BPH with LUTS:   IPSS score is 9/1.  Follow up is pending RUS and KUB results.    - BLADDER SCAN AMB NON-IMAGING   Return in about 2 weeks (around 01/16/2016) for RUS report, KUB report and UA.  These notes generated with voice recognition software. I apologize for typographical errors.  Zara Council, [facility] [address] [place], [place] [address] [phone]

## 2016-01-12 ENCOUNTER — Ambulatory Visit
Admission: RE | Admit: 2016-01-12 | Discharge: 2016-01-12 | Disposition: A | Discharge status: Home / Self Care | Payer: Medicare Other | Source: Ambulatory Visit | Attending: Urology | Admitting: Urology

## 2016-01-12 DIAGNOSIS — N281 Cyst of kidney, acquired: Secondary | ICD-10-CM | POA: Diagnosis not present

## 2016-01-12 DIAGNOSIS — N390 Urinary tract infection, site not specified: Secondary | ICD-10-CM | POA: Diagnosis not present

## 2016-01-16 ENCOUNTER — Ambulatory Visit (INDEPENDENT_AMBULATORY_CARE_PROVIDER_SITE_OTHER): Payer: Medicare Other | Admitting: Obstetrics and Gynecology

## 2016-01-16 ENCOUNTER — Encounter: Payer: Self-pay | Admitting: Obstetrics and Gynecology

## 2016-01-16 ENCOUNTER — Ambulatory Visit
Admission: RE | Admit: 2016-01-16 | Discharge: 2016-01-16 | Disposition: A | Discharge status: Home / Self Care | Payer: Medicare Other | Source: Ambulatory Visit | Attending: Urology | Admitting: Urology

## 2016-01-16 VITALS — BP 159/66 | HR 69 | Resp 16 | Ht 74.0 in | Wt 186.4 lb

## 2016-01-16 DIAGNOSIS — N390 Urinary tract infection, site not specified: Secondary | ICD-10-CM

## 2016-01-16 LAB — URINALYSIS, COMPLETE
BILIRUBIN UA: NEGATIVE
Glucose, UA: NEGATIVE
Leukocytes, UA: NEGATIVE
Nitrite, UA: NEGATIVE
PH UA: 5 (ref 5.0–7.5)
SPEC GRAV UA: 1.02 (ref 1.005–1.030)
UUROB: 0.2 mg/dL (ref 0.2–1.0)

## 2016-01-16 LAB — MICROSCOPIC EXAMINATION: Bacteria, UA: NONE SEEN

## 2016-01-16 NOTE — Progress Notes (Signed)
9:02 AM   Terrance Carrillo May 26, 1934 [id number]  Referring provider: Glean Hess, MD [address] [place], [name]  Chief Complaint  Patient presents with  . Recurrent UTI    HPI: Patient is a 82 year old Caucasian male who presents to [place] have requested by his primary care provider Dr. Army Melia for a persistent positive UA.  Patient is a poor historian. He states that he doesn't understand why he is being seen by urology.  He stated he went and saw Dr. Army Melia  for odor in his urine.  He did have a recorded temperature 104F, but the patient denies this finding.  He denies any dysuria, gross hematuria or suprapubic pain.  He denies any flank pain. He denies fevers, chills, nausea or vomiting.   Patient had a positive urinary tract infection for Klebsiella pneumonia on 12/09/2015.   He also is positive again for Klebsiella pneumonia on 12/26/2015.  His UA today is unremarkable, but he is currently on an antibiotic.  He does have a prior history of nephrolithiasis several years ago.  He did pass that stone spontaneously.    He has baseline urinary symptoms of feeling of incomplete emptying, frequency, not dysuria and a weak urinary stream. His I PSS score is 9/1. His PVR today is 27 mL.   Interval History 01/16/16 Patient denies any urinary symptoms today.  Has completed his anitbiotics. RUS unremarkable except for a large left renal cyst.       IPSS      01/02/16 1000       International Prostate Symptom Score   How often have you had the sensation of not emptying your bladder? Less than 1 in 5     How often have you had to urinate less than every two hours? Less than 1 in 5 times     How often have you found you stopped and started again several times when you urinated? Not at All     How often have you found it difficult to postpone urination? Not at All     How often have you had a weak urinary stream? Less than half the time     How often have you  had to strain to start urination? Not at All     How many times did you typically get up at night to urinate? 5 Times     Total IPSS Score 9     Quality of Life due to urinary symptoms   If you were to spend the rest of your life with your urinary condition just the way it is now how would you feel about that? Pleased        Score:  1-7 Mild 8-19 Moderate 20-35 Severe   PMH: Past Medical History  Diagnosis Date  . Heartburn   . Diabetes ([place])   . Heart attack ([place])   . Heart failure ([place])   . Heart disease   . HLD (hyperlipidemia)   . HTN (hypertension)   . Hypothyroidism   . Urge incontinence     Surgical History: Past Surgical History  Procedure Laterality Date  . Coronary artery bypass graft  2011    x3  . Cardiac defibrillator placement  2014    PSVT  . Cataract extraction, bilateral      Home Medications:    Medication List       This list is accurate as of: 01/16/16 11:59 PM.  Always use your most  recent med list.               aspirin 81 MG tablet  Take by mouth.     ciprofloxacin 250 MG tablet  Commonly known as:  CIPRO  Take 1 tablet (250 mg total) by mouth 2 (two) times daily.     dicyclomine 10 MG capsule  Commonly known as:  BENTYL  Take 1 capsule (10 mg total) by mouth 4 (four) times daily -  before meals and at bedtime.     furosemide 20 MG tablet  Commonly known as:  LASIX  TAKE (1) TABLET BY MOUTH EVERY DAY     glimepiride 2 MG tablet  Commonly known as:  AMARYL  TAKE ONE (1) TABLET BY MOUTH ONCE DAILY     levothyroxine 25 MCG tablet  Commonly known as:  SYNTHROID, LEVOTHROID  TAKE 1 TABLET ON AN EMPTY STOMACH WITH A GLASS OF WATER AT LEAST 30 TO 60 MINUTES BEFORE BREAKFAST.     lisinopril 5 MG tablet  Commonly known as:  PRINIVIL,ZESTRIL  Take by mouth daily.     magnesium oxide 400 MG tablet  Commonly known as:  MAG-OX  Take by mouth.     metFORMIN 1000 MG tablet  Commonly known as:  GLUCOPHAGE  TAKE (1) TABLET BY  MOUTH TWICE DAILY     metoprolol 50 MG tablet  Commonly known as:  LOPRESSOR  Take 0.5 tablets by mouth 2 (two) times daily.     MULTI-VITAMINS Tabs  Take by mouth. Reported on 01/16/2016     potassium chloride SA 20 MEQ tablet  Commonly known as:  K-DUR,KLOR-CON  Take 1 tablet by mouth daily at 2 PM daily at 2 PM.     simvastatin 40 MG tablet  Commonly known as:  ZOCOR  TAKE (1) TABLET BY MOUTH DAILY AT BEDTIME     sotalol 120 MG tablet  Commonly known as:  BETAPACE  Take 1 tablet by mouth 2 (two) times daily.        Allergies:  Allergies  Allergen Reactions  . Penicillins     Family History: Family History  Problem Relation Age of Onset  . Melanoma Father   . Kidney disease Neg Hx   . Prostate cancer Neg Hx     Social History:  reports that he has never smoked. He does not have any smokeless tobacco history on file. He reports that he does not drink alcohol or use illicit drugs.  ROS: UROLOGY Frequent Urination?: No Hard to postpone urination?: No Burning/pain with urination?: No Get up at night to urinate?: No Leakage of urine?: No Urine stream starts and stops?: No Trouble starting stream?: No Do you have to strain to urinate?: No Blood in urine?: No Urinary tract infection?: No Sexually transmitted disease?: No Injury to kidneys or bladder?: No Painful intercourse?: No Weak stream?: No Erection problems?: No Penile pain?: No  Gastrointestinal Nausea?: No Vomiting?: No Indigestion/heartburn?: No Diarrhea?: No Constipation?: No  Constitutional Fever: No Night sweats?: No Weight loss?: No Fatigue?: No  Skin Skin rash/lesions?: No Itching?: No  Eyes Blurred vision?: No Double vision?: No  Ears/Nose/Throat Sore throat?: No Sinus problems?: No  Hematologic/Lymphatic Swollen glands?: No Easy bruising?: No  Cardiovascular Leg swelling?: No Chest pain?: No  Respiratory Cough?: No Shortness of breath?: No  Endocrine Excessive  thirst?: No  Musculoskeletal Back pain?: No Joint pain?: No  Neurological Headaches?: No Dizziness?: No  Psychologic Depression?: No Anxiety?: No  Physical Exam: BP  159/66 mmHg  Pulse 69  Resp 16  Ht 6\' 2"  (1.88 m)  Wt 186 lb 6.4 oz (84.55 kg)  BMI 23.92 kg/m2  Constitutional: Well nourished. Alert and oriented, No acute distress. HEENT: [place] AT, moist mucus membranes. Trachea midline, no masses. Cardiovascular: No clubbing, cyanosis, or edema. Respiratory: Normal respiratory effort, no increased work of breathing. Skin: No rashes, bruises or suspicious lesions. Neurologic: Grossly intact, no focal deficits, moving all 4 extremities. Psychiatric: Normal mood and affect.  Laboratory Data:   Lab Results  Component Value Date   CREATININE 1.16 08/08/2015     Lab Results  Component Value Date   HGBA1C 7.2* 12/09/2015    Lab Results  Component Value Date   TSH 3.160 08/08/2015      Urinalysis Results for orders placed or performed in visit on 01/16/16  Microscopic Examination  Result Value Ref Range   WBC, UA 0-5 0 -  5 /hpf   RBC, UA 0-2 0 -  2 /hpf   Epithelial Cells (non renal) 0-10 0 - 10 /hpf   Casts Present (A) None seen /lpf   Cast Type Hyaline casts N/A   Bacteria, UA None seen None seen/Few  Urinalysis, Complete  Result Value Ref Range   Specific Gravity, UA 1.020 1.005 - 1.030   pH, UA 5.0 5.0 - 7.5   Color, UA Yellow Yellow   Appearance Ur Clear Clear   Leukocytes, UA Negative Negative   Protein, UA 1+ (A) Negative/Trace   Glucose, UA Negative Negative   Ketones, UA Trace (A) Negative   RBC, UA Trace (A) Negative   Bilirubin, UA Negative Negative   Urobilinogen, Ur 0.2 0.2 - 1.0 mg/dL   Nitrite, UA Negative Negative   Microscopic Examination See below:     Pertinent Imaging: Results for WALDO, BRUCATO (MRN [id number]) as of 01/02/2016 10:45  Ref. Range 01/02/2016 10:35  Scan Result Unknown 27   CLINICAL DATA: Recurrent UTIs,  diabetes mellitus, hypertension, heart failure  EXAM: RENAL / URINARY TRACT ULTRASOUND COMPLETE  COMPARISON: None  FINDINGS: Right Kidney:  Length: 11.8 cm. Normal cortical thickness and echogenicity. No mass, hydronephrosis or shadowing calcification.  Left Kidney:  Length: 14.8 cm. Normal cortical thickness and echogenicity. Large cyst mid LEFT kidney 9.7 x 6.7 x 8.5 cm. No additional mass, hydronephrosis or shadowing calcification.  Bladder:  Partially distended, grossly normal appearance.  Ureteral jets were not visualized.  Calculated prevoid volume 124 mL with postvoid residual of 20 mL.  IMPRESSION: Large LEFT renal cyst.  Small bladder postvoid residual volume.  Otherwise negative renal ultrasound.   Electronically Signed  By: Lavonia Dana M.D.  On: 01/12/2016 14:27  Assessment & Plan:    1. UTI:    Patient had a positive urinary tract infection for Klebsiella pneumonia on 12/09/2015.   He also is positive again for Klebsiella pneumonia on 12/26/2015.   He also has a recorded high temperature was associated with this infection.  - UA unremarkable today and patient asymptomatic. Renal ultrasound positive for large left renal cyst otherwise unremarkable.   Patient's KUB was not performed at the time of RUS.  He will go get his KUB today and follow up with Larene Beach to review. - Urinalysis, Complete  2. BPH with LUTS:   IPSS score is 9/1.     Return in about 3 months (around 04/17/2016) for with [facility] recheck UTI/LUTS.  These notes generated with voice recognition software. I apologize for typographical errors.  Herbert Moors, FNP  [facility] [address], [place], [place] [address] [phone]

## 2016-01-23 ENCOUNTER — Telehealth: Payer: Self-pay

## 2016-01-23 NOTE — Telephone Encounter (Signed)
Patient's wife notified of message and UA was scheduled for 64month drop off

## 2016-01-23 NOTE — Telephone Encounter (Signed)
-----   Message from Nori Riis, PA-C sent at 01/18/2016 10:23 AM EDT ----- Patient may have bilateral stones found on KUB.  These could be a nidus for infection.  If he should have another UTI, I would suggest obtaining a CT stone study for further evaluation.  I would like to have an UA performed again in one month.

## 2016-01-25 ENCOUNTER — Other Ambulatory Visit: Payer: Self-pay | Admitting: Internal Medicine

## 2016-02-03 ENCOUNTER — Other Ambulatory Visit: Payer: Self-pay

## 2016-02-03 DIAGNOSIS — K589 Irritable bowel syndrome without diarrhea: Secondary | ICD-10-CM

## 2016-02-03 MED ORDER — DICYCLOMINE HCL 10 MG PO CAPS: 10.0000 mg | ORAL_CAPSULE | Freq: Three times a day (TID) | ORAL | Status: DC

## 2016-02-03 NOTE — Telephone Encounter (Signed)
Patient called in requesting a refill of medication

## 2016-02-22 ENCOUNTER — Other Ambulatory Visit: Payer: Self-pay

## 2016-02-22 DIAGNOSIS — R319 Hematuria, unspecified: Secondary | ICD-10-CM

## 2016-02-23 ENCOUNTER — Other Ambulatory Visit: Payer: Medicare Other

## 2016-02-23 DIAGNOSIS — R319 Hematuria, unspecified: Secondary | ICD-10-CM

## 2016-02-24 ENCOUNTER — Telehealth: Payer: Self-pay

## 2016-02-24 DIAGNOSIS — N390 Urinary tract infection, site not specified: Secondary | ICD-10-CM

## 2016-02-24 LAB — MICROSCOPIC EXAMINATION: RBC, UA: NONE SEEN /hpf (ref 0–?)

## 2016-02-24 LAB — URINALYSIS, COMPLETE
Bilirubin, UA: NEGATIVE
GLUCOSE, UA: NEGATIVE
Ketones, UA: NEGATIVE
Nitrite, UA: POSITIVE — AB
PH UA: 5.5 (ref 5.0–7.5)
PROTEIN UA: NEGATIVE
RBC, UA: NEGATIVE
Specific Gravity, UA: 1.02 (ref 1.005–1.030)
UUROB: 0.2 mg/dL (ref 0.2–1.0)

## 2016-02-24 MED ORDER — SULFAMETHOXAZOLE-TRIMETHOPRIM 800-160 MG PO TABS: 1.0000 | ORAL_TABLET | Freq: Two times a day (BID) | ORAL | Status: AC

## 2016-02-24 NOTE — Telephone Encounter (Signed)
-----   Message from Nori Riis, PA-C sent at 02/24/2016  7:31 AM EDT ----- Please have patient start Septra DS 1 tablet twice daily for 7 days.  We may need to change the antibiotic once the sensitivities are available on Monday.  We will contact patient once final culture results are received.

## 2016-02-24 NOTE — Telephone Encounter (Signed)
Spoke with pt wife in reference to u/a and the need for an abx. Wife voiced understanding.

## 2016-02-27 LAB — CULTURE, URINE COMPREHENSIVE

## 2016-02-28 ENCOUNTER — Telehealth: Payer: Self-pay

## 2016-02-28 DIAGNOSIS — N390 Urinary tract infection, site not specified: Secondary | ICD-10-CM

## 2016-02-28 MED ORDER — SULFAMETHOXAZOLE-TRIMETHOPRIM 800-160 MG PO TABS: 1.0000 | ORAL_TABLET | Freq: Two times a day (BID) | ORAL | Status: AC

## 2016-02-28 NOTE — Telephone Encounter (Signed)
Spoke with pt in reference to +ucx. Made aware abx were sent to his pharmacy and the need of a cath specimen post abx therapy. Pt voiced understanding stating he will call back for an appt.

## 2016-02-28 NOTE — Telephone Encounter (Signed)
-----   Message from Nori Riis, PA-C sent at 02/27/2016  8:05 AM EDT ----- Patient has a positive urine culture result.  He will need to start Septra DS 1 tablet twice daily for 7 days.  We'll then need to recheck a CATH specimen 3-5 days after completing the antibiotic.

## 2016-03-01 ENCOUNTER — Telehealth: Payer: Self-pay | Admitting: Urology

## 2016-03-01 DIAGNOSIS — N390 Urinary tract infection, site not specified: Secondary | ICD-10-CM

## 2016-03-01 NOTE — Telephone Encounter (Signed)
Curt Bears called regarding Mr. Rawlinson. She said he's taken three pills of Sulfamethoxazole and now has a big rash on his face and his face is bright red. It began yesterday and hasn't gone away today. He's not having problems breathing. She's wondering if a different medication can be prescribed for Mr. Intriago. Please give her a call.  Pt's ph# [phone] Thank you.

## 2016-03-01 NOTE — Telephone Encounter (Signed)
Please have the patient stop the sulfamethoxazole and document his allergy and the reaction. He is reaction to penicillin needs to be documented in his chart as well. He may start Cipro 250 mg 1 tablet twice daily for 7 days. We will need to recheck a UA and urine culture 3-5 days after he completes the antibiotic.

## 2016-03-01 NOTE — Telephone Encounter (Signed)
Please advise 

## 2016-03-02 MED ORDER — CIPROFLOXACIN HCL 250 MG PO TABS
250.0000 mg | ORAL_TABLET | Freq: Two times a day (BID) | ORAL | Status: DC
Start: 2016-03-02 — End: 2016-04-17

## 2016-03-02 NOTE — Telephone Encounter (Signed)
Spoke with pt wife and made aware to stop sulfa medication and cipro will be sent to pt pharmacy. Wife voiced understanding. Medication sent to pharmacy. Sulfa added to allergies and reaction to penicillin added.

## 2016-03-26 ENCOUNTER — Other Ambulatory Visit: Payer: Medicare Other

## 2016-03-26 DIAGNOSIS — N390 Urinary tract infection, site not specified: Secondary | ICD-10-CM

## 2016-03-26 LAB — URINALYSIS, COMPLETE
Bilirubin, UA: NEGATIVE
Glucose, UA: NEGATIVE
Ketones, UA: NEGATIVE
Nitrite, UA: NEGATIVE
PH UA: 7 (ref 5.0–7.5)
Protein, UA: NEGATIVE
RBC, UA: NEGATIVE
Specific Gravity, UA: 1.015 (ref 1.005–1.030)
Urobilinogen, Ur: 0.2 mg/dL (ref 0.2–1.0)

## 2016-03-26 LAB — MICROSCOPIC EXAMINATION: BACTERIA UA: NONE SEEN

## 2016-03-30 ENCOUNTER — Other Ambulatory Visit: Payer: Self-pay | Admitting: Internal Medicine

## 2016-04-04 NOTE — Telephone Encounter (Signed)
pts coming in on 9/8 for maw

## 2016-04-17 ENCOUNTER — Encounter: Payer: Self-pay | Admitting: Urology

## 2016-04-17 ENCOUNTER — Ambulatory Visit (INDEPENDENT_AMBULATORY_CARE_PROVIDER_SITE_OTHER): Payer: Medicare Other | Admitting: Urology

## 2016-04-17 VITALS — BP 135/66 | HR 65 | Ht 74.0 in | Wt 184.0 lb

## 2016-04-17 DIAGNOSIS — N390 Urinary tract infection, site not specified: Secondary | ICD-10-CM

## 2016-04-17 DIAGNOSIS — N200 Calculus of kidney: Secondary | ICD-10-CM | POA: Diagnosis not present

## 2016-04-17 DIAGNOSIS — N401 Enlarged prostate with lower urinary tract symptoms: Secondary | ICD-10-CM

## 2016-04-17 DIAGNOSIS — N138 Other obstructive and reflux uropathy: Secondary | ICD-10-CM

## 2016-04-17 LAB — URINALYSIS, COMPLETE
Bilirubin, UA: NEGATIVE
GLUCOSE, UA: NEGATIVE
KETONES UA: NEGATIVE
NITRITE UA: POSITIVE — AB
Protein, UA: NEGATIVE
SPEC GRAV UA: 1.02 (ref 1.005–1.030)
Urobilinogen, Ur: 0.2 mg/dL (ref 0.2–1.0)
pH, UA: 5 (ref 5.0–7.5)

## 2016-04-17 LAB — MICROSCOPIC EXAMINATION

## 2016-04-17 LAB — BLADDER SCAN AMB NON-IMAGING

## 2016-04-17 NOTE — Progress Notes (Signed)
10:06 AM   Terrance Carrillo 1934-03-09 [id number]  Referring provider: Glean Hess, MD [address] [place], [place] [address]  Chief Complaint  Patient presents with  . Benign Prostatic Hypertrophy    62months    HPI: Patient is a 82 year old Caucasian male who presents today for 3 month follow-up for an UTI associated with a high fever and lower urinary tract symptoms with BPH.  Background history Patient was referred by his primary care provider Dr. Army Melia for a persistent positive UA.  Patient is a poor historian. He didn't understand why he is being seen by urology.  He stated he went and saw Dr. Army Melia  for odor in his urine.  He did have a recorded temperature 104F, but the patient denies this finding.  He denies any dysuria, gross hematuria or suprapubic pain.  He denies any flank pain. He denies fevers, chills, nausea or vomiting.   Patient had a positive urinary tract infection for Klebsiella pneumonia on 12/09/2015.   He also is positive again for Klebsiella pneumonia on 12/26/2015.    He does have a prior history of nephrolithiasis several years ago.  He did pass that stone spontaneously.  RUS from 01/12/2016 noted a large left renal cyst and a small bladder postvoid residual volume.  KUB from 01/16/2016 noted suspected bilateral nonobstructing nephrolithiasis.  He has baseline urinary symptoms of feeling of incomplete emptying, frequency, not dysuria and a weak urinary stream. His I PSS score is 5/1. His PVR today is 0 mL.       IPSS      04/17/16 0900       International Prostate Symptom Score   How often have you had the sensation of not emptying your bladder? Not at All     How often have you had to urinate less than every two hours? Not at All     How often have you found you stopped and started again several times when you urinated? Not at All     How often have you found it difficult to postpone urination? Not at All     How often have you  had a weak urinary stream? Not at All     How often have you had to strain to start urination? Not at All     How many times did you typically get up at night to urinate? 5 Times     Total IPSS Score 5     Quality of Life due to urinary symptoms   If you were to spend the rest of your life with your urinary condition just the way it is now how would you feel about that? Pleased        Score:  1-7 Mild 8-19 Moderate 20-35 Severe   PMH: Past Medical History  Diagnosis Date  . Heartburn   . Diabetes ([place])   . Heart attack ([place])   . Heart failure ([place])   . Heart disease   . HLD (hyperlipidemia)   . HTN (hypertension)   . Hypothyroidism   . Urge incontinence     Surgical History: Past Surgical History  Procedure Laterality Date  . Coronary artery bypass graft  2011    x3  . Cardiac defibrillator placement  2014    PSVT  . Cataract extraction, bilateral      Home Medications:    Medication List       This list is accurate as of: 04/17/16 10:06 AM.  Always use your most recent med list.               aspirin 81 MG tablet  Take by mouth.     dicyclomine 10 MG capsule  Commonly known as:  BENTYL  Take 1 capsule (10 mg total) by mouth 4 (four) times daily -  before meals and at bedtime.     furosemide 20 MG tablet  Commonly known as:  LASIX  TAKE (1) TABLET BY MOUTH EVERY DAY     glimepiride 2 MG tablet  Commonly known as:  AMARYL  TAKE (1) TABLET BY MOUTH EVERY DAY     levothyroxine 25 MCG tablet  Commonly known as:  SYNTHROID, LEVOTHROID  TAKE 1 TABLET ON AN EMPTY STOMACH WITH A GLASS OF WATER AT LEAST 30 TO 60 MINUTES BEFORE BREAKFAST.     lisinopril 5 MG tablet  Commonly known as:  PRINIVIL,ZESTRIL  Take by mouth daily.     magnesium oxide 400 MG tablet  Commonly known as:  MAG-OX  Take by mouth.     metFORMIN 1000 MG tablet  Commonly known as:  GLUCOPHAGE  TAKE (1) TABLET BY MOUTH TWICE DAILY     metoprolol 50 MG tablet  Commonly known as:   LOPRESSOR  Take 0.5 tablets by mouth 2 (two) times daily.     MULTI-VITAMINS Tabs  Take by mouth. Reported on 01/16/2016     potassium chloride SA 20 MEQ tablet  Commonly known as:  K-DUR,KLOR-CON  TAKE ONE (1) TABLET BY MOUTH ONCE DAILY     simvastatin 40 MG tablet  Commonly known as:  ZOCOR  TAKE (1) TABLET BY MOUTH DAILY AT BEDTIME     sotalol 120 MG tablet  Commonly known as:  BETAPACE  Take 1 tablet by mouth 2 (two) times daily.        Allergies:  Allergies  Allergen Reactions  . Penicillins Rash  . Sulfa Antibiotics Rash    Full body rash, itching, face turns bright red    Family History: Family History  Problem Relation Age of Onset  . Melanoma Father   . Kidney disease Neg Hx   . Prostate cancer Neg Hx     Social History:  reports that he has never smoked. He does not have any smokeless tobacco history on file. He reports that he does not drink alcohol or use illicit drugs.  ROS: UROLOGY Frequent Urination?: No Hard to postpone urination?: No Burning/pain with urination?: No Get up at night to urinate?: No Leakage of urine?: No Urine stream starts and stops?: No Trouble starting stream?: No Do you have to strain to urinate?: No Blood in urine?: No Urinary tract infection?: No Sexually transmitted disease?: No Injury to kidneys or bladder?: No Painful intercourse?: No Weak stream?: No Erection problems?: No Penile pain?: No  Gastrointestinal Nausea?: No Vomiting?: No Indigestion/heartburn?: No Diarrhea?: No Constipation?: No  Constitutional Fever: No Night sweats?: No Weight loss?: No Fatigue?: No  Skin Skin rash/lesions?: No Itching?: No  Eyes Blurred vision?: No Double vision?: No  Ears/Nose/Throat Sore throat?: No Sinus problems?: No  Hematologic/Lymphatic Swollen glands?: No Easy bruising?: No  Cardiovascular Leg swelling?: No Chest pain?: No  Respiratory Cough?: No Shortness of breath?: No  Endocrine Excessive  thirst?: No  Musculoskeletal Back pain?: No Joint pain?: No  Neurological Headaches?: No Dizziness?: No  Psychologic Depression?: No Anxiety?: No  Physical Exam: BP 135/66 mmHg  Pulse 65  Ht 6\' 2"  (1.88 m)  Wt  184 lb (83.462 kg)  BMI 23.61 kg/m2  Constitutional: Well nourished. Alert and oriented, No acute distress. HEENT: [place] AT, moist mucus membranes. Trachea midline, no masses. Cardiovascular: No clubbing, cyanosis, or edema. Respiratory: Normal respiratory effort, no increased work of breathing. Skin: No rashes, bruises or suspicious lesions. Neurologic: Grossly intact, no focal deficits, moving all 4 extremities. Psychiatric: Normal mood and affect.  Laboratory Data:  Lab Results  Component Value Date   CREATININE 1.16 08/08/2015     Lab Results  Component Value Date   HGBA1C 7.2* 12/09/2015    Lab Results  Component Value Date   TSH 3.160 08/08/2015   Urinalysis Results for orders placed or performed in visit on 04/17/16  Microscopic Examination  Result Value Ref Range   WBC, UA 11-30 (A) 0 -  5 /hpf   RBC, UA 0-2 0 -  2 /hpf   Epithelial Cells (non renal) 0-10 0 - 10 /hpf   Bacteria, UA Few None seen/Few  Urinalysis, Complete  Result Value Ref Range   Specific Gravity, UA 1.020 1.005 - 1.030   pH, UA 5.0 5.0 - 7.5   Color, UA Yellow Yellow   Appearance Ur Hazy (A) Clear   Leukocytes, UA Trace (A) Negative   Protein, UA Negative Negative/Trace   Glucose, UA Negative Negative   Ketones, UA Negative Negative   RBC, UA Trace (A) Negative   Bilirubin, UA Negative Negative   Urobilinogen, Ur 0.2 0.2 - 1.0 mg/dL   Nitrite, UA Positive (A) Negative   Microscopic Examination See below:   BLADDER SCAN AMB NON-IMAGING  Result Value Ref Range   Scan Result 77ml    Pertinent Imaging: Results for INTI, KROLL (MRN [id number]) as of 04/19/2016 15:56  Ref. Range 04/17/2016 09:51  Scan Result Unknown 80ml    Assessment & Plan:    1. UTI:    Patient  had a positive urinary tract infection for Klebsiella pneumonia on 12/09/2015.   He also is positive again for Klebsiella pneumonia on 12/26/2015.   He had a recorded high temperature was associated with this infection. UA unremarkable today and patient asymptomatic. Renal ultrasound positive for large left renal cyst otherwise unremarkable.   Patient's KUB was suspicious for bilateral nephrolithiasis.  He will contact our office if he should experience symptoms of an UTI.    - Urinalysis, Complete  2. BPH with LUTS:   IPSS score is 5/1.  We will continue to monitor.  He will RTC in one year for IPSS and exam.    3. Bilateral nephrolithiasis:   Patient's KUB was suspicious for bilateral nephrolithiasis. I did offer the patient a CT renal stone study, but he deferred at this time.  He will contact our office or seek treatment in the ED if she becomes febrile or pain and difficult control in order to arrange for emergent/urgent intervention.  We will repeat the KUB in one year.    Return in about 9 months (around 01/15/2017) for KUB, exam and IPSS score.  These notes generated with voice recognition software. I apologize for typographical errors.  Zara Council, [facility] [address], [place], [place] [address] [phone]

## 2016-07-13 ENCOUNTER — Encounter: Payer: Medicare Other | Admitting: Internal Medicine

## 2016-07-23 ENCOUNTER — Other Ambulatory Visit: Payer: Self-pay | Admitting: Internal Medicine

## 2016-08-02 ENCOUNTER — Other Ambulatory Visit: Payer: Self-pay | Admitting: Internal Medicine

## 2016-08-08 ENCOUNTER — Ambulatory Visit (INDEPENDENT_AMBULATORY_CARE_PROVIDER_SITE_OTHER): Payer: Medicare Other | Admitting: Internal Medicine

## 2016-08-08 ENCOUNTER — Encounter: Payer: Self-pay | Admitting: Internal Medicine

## 2016-08-08 VITALS — BP 122/78 | HR 62 | Resp 16 | Ht 74.0 in | Wt 182.2 lb

## 2016-08-08 DIAGNOSIS — Z23 Encounter for immunization: Secondary | ICD-10-CM | POA: Diagnosis not present

## 2016-08-08 DIAGNOSIS — E039 Hypothyroidism, unspecified: Secondary | ICD-10-CM | POA: Diagnosis not present

## 2016-08-08 DIAGNOSIS — K602 Anal fissure, unspecified: Secondary | ICD-10-CM | POA: Diagnosis not present

## 2016-08-08 DIAGNOSIS — I472 Ventricular tachycardia: Secondary | ICD-10-CM | POA: Diagnosis not present

## 2016-08-08 DIAGNOSIS — E119 Type 2 diabetes mellitus without complications: Secondary | ICD-10-CM | POA: Diagnosis not present

## 2016-08-08 DIAGNOSIS — I509 Heart failure, unspecified: Secondary | ICD-10-CM

## 2016-08-08 DIAGNOSIS — I4729 Other ventricular tachycardia: Secondary | ICD-10-CM

## 2016-08-08 MED ORDER — METFORMIN HCL 500 MG PO TABS: 500.0000 mg | ORAL_TABLET | Freq: Two times a day (BID) | ORAL | 5 refills | Status: DC

## 2016-08-08 MED ORDER — HYDROCORTISONE 2.5 % RE CREA: 1.0000 "application " | TOPICAL_CREAM | Freq: Two times a day (BID) | RECTAL | 0 refills | Status: DC

## 2016-08-08 NOTE — Progress Notes (Signed)
Date:  08/08/2016   Name:  Terrance Carrillo   DOB:  June 16, 1934   MRN:  [id number]   Chief Complaint: Diabetes Diabetes  He presents for his follow-up diabetic visit. He has type 2 diabetes mellitus. His disease course has been stable. Pertinent negatives for diabetes include no chest pain and no fatigue.  Thyroid Problem  Presents for follow-up visit. Symptoms include diarrhea. Patient reports no fatigue or palpitations. The symptoms have been stable.   CAD - recent Cath showed two significant stenosis with collaterals.  CABG grafts patent.  No lesion amenable to PTCA.  Cardiac function preserved.  Rectal bleeding/diarrhea - he reports having sudden diarrhea off and on for some time.  After recent Nuclear medicine testing it was much worse for a day or two.  Since that time he has had bright red rectal blood on the tissue after a BM.  He has some mild discomfort described at stinging.  He has not used any topical agents.  He is also on metformin IR 1000 mg bid.  Lab Results  Component Value Date   HGBA1C 7.2 (H) 12/09/2015     Review of Systems  Constitutional: Negative for chills, fatigue and fever.  Respiratory: Positive for shortness of breath. Negative for cough, chest tightness and wheezing.   Cardiovascular: Negative for chest pain, palpitations and leg swelling.  Gastrointestinal: Positive for anal bleeding and diarrhea. Negative for nausea and vomiting.  Genitourinary: Negative for dysuria and hematuria.  Musculoskeletal: Negative for arthralgias.  Psychiatric/Behavioral: Negative for dysphoric mood and sleep disturbance.    Patient Active Problem List   Diagnosis Date Noted  . BPH with obstruction/lower urinary tract symptoms 01/02/2016  . Recurrent UTI 01/02/2016  . Hiatal hernia 12/09/2015  . Irritable bowel syndrome (IBS) 12/09/2015  . Essential hypertension 12/09/2015  . Idiopathic insomnia 06/30/2015  . Local edema 06/30/2015  . Cardiomyopathy, ischemic  06/30/2015  . History of prolonged Q-T interval on ECG 06/30/2015  . Acid reflux 06/30/2015  . Allergic state 06/30/2015  . Dyslipidemia 06/30/2015  . Chronic diastolic heart failure ([place]) 06/30/2015  . Acquired hypothyroidism 06/30/2015  . Other long term (current) drug therapy 06/05/2012  . MI (mitral incompetence) 01/24/2012  . Diabetes mellitus without complication ([place]) [id number]  . CCF (congestive cardiac failure) ([place]) 10/11/2011  . Congestive heart failure ([place]) 10/11/2011  . Paroxysmal ventricular tachycardia ([place]) 08/23/2011  . Arteriosclerosis of coronary artery 08/23/2011  . Automatic implantable cardioverter-defibrillator in situ 08/16/2011    Prior to Admission medications   Medication Sig Start Date End Date Taking? Authorizing Provider  aspirin 81 MG tablet Take by mouth. 11/16/10  Yes Historical Provider, MD  furosemide (LASIX) 20 MG tablet TAKE (1) TABLET BY MOUTH EVERY DAY 08/02/16  Yes Glean Hess, MD  glimepiride (AMARYL) 2 MG tablet TAKE (1) TABLET BY MOUTH EVERY DAY 01/25/16  Yes Glean Hess, MD  levothyroxine (SYNTHROID, LEVOTHROID) 25 MCG tablet TAKE 1 TABLET ON AN EMPTY STOMACH WITH A GLASS OF WATER AT LEAST 30 TO 60 MINUTES BEFORE BREAKFAST. 12/28/15  Yes Glean Hess, MD  lisinopril (PRINIVIL,ZESTRIL) 5 MG tablet Take by mouth daily. 08/29/15  Yes Historical Provider, MD  magnesium oxide (MAG-OX) 400 MG tablet Take by mouth. 12/28/15 12/27/16 Yes Historical Provider, MD  metFORMIN (GLUCOPHAGE) 1000 MG tablet TAKE (1) TABLET BY MOUTH TWICE DAILY 07/23/16  Yes Glean Hess, MD  metoprolol (LOPRESSOR) 50 MG tablet Take 0.5 tablets by mouth 2 (two) times daily.  01/11/15  Yes Historical Provider, MD  Multiple Vitamin (MULTI-VITAMINS) TABS Take by mouth. Reported on 01/16/2016   Yes Historical Provider, MD  potassium chloride SA (K-DUR,KLOR-CON) 20 MEQ tablet TAKE ONE (1) TABLET BY MOUTH ONCE DAILY 03/30/16  Yes Glean Hess, MD  simvastatin (ZOCOR) 40  MG tablet TAKE (1) TABLET BY MOUTH DAILY AT BEDTIME 01/25/16  Yes Glean Hess, MD  sotalol (BETAPACE) 120 MG tablet Take by mouth. 07/04/16  Yes Historical Provider, MD    Allergies  Allergen Reactions  . Penicillins Rash  . Sulfa Antibiotics Rash    Full body rash, itching, face turns bright red    Past Surgical History:  Procedure Laterality Date  . CARDIAC DEFIBRILLATOR PLACEMENT  2014   PSVT  . CATARACT EXTRACTION, BILATERAL    . CORONARY ARTERY BYPASS GRAFT  2011   x3    Social History  Substance Use Topics  . Smoking status: Never Smoker  . Smokeless tobacco: Never Used  . Alcohol use No     Medication list has been reviewed and updated.   Physical Exam  Constitutional: He is oriented to person, place, and time. He appears well-developed. No distress.  HENT:  Head: Normocephalic and atraumatic.  Cardiovascular: Normal rate, regular rhythm and normal heart sounds.   Pulmonary/Chest: Effort normal and breath sounds normal. No respiratory distress. He has no wheezes.  Abdominal: Soft. Bowel sounds are normal.  Genitourinary: Rectal exam shows external hemorrhoid. Rectal exam shows no internal hemorrhoid, no mass and no tenderness.  Genitourinary Comments: Small clot at rectum - no internal mass or tenderness noted.  BRB on gloved finger.  Musculoskeletal: Normal range of motion.  Neurological: He is alert and oriented to person, place, and time.  Skin: Skin is warm and dry. No rash noted.  Psychiatric: He has a normal mood and affect. His behavior is normal. Thought content normal.  Nursing note and vitals reviewed.   BP 122/78   Pulse 62   Resp 16   Ht 6\' 2"  (1.88 m)   Wt 182 lb 3.2 oz (82.6 kg)   SpO2 98%   BMI 23.39 kg/m   Assessment and Plan: 1. Type 2 diabetes mellitus without complication, without long-term current use of insulin (HCC) Reduce dose of metformin due to diarrhea - Comprehensive metabolic panel - Hemoglobin A1c - metFORMIN  (GLUCOPHAGE) 500 MG tablet; Take 1 tablet (500 mg total) by mouth 2 (two) times daily with a meal.  Dispense: 60 tablet; Refill: 5  2. Congestive heart failure, unspecified congestive heart failure chronicity, unspecified congestive heart failure type (HCC) stable - Comprehensive metabolic panel  3. Paroxysmal ventricular tachycardia (HCC) controlled  4. Acquired hypothyroidism supplemented - TSH  5. Rectal fissure Refer to GI if bleeding continues - hydrocortisone (ANUSOL-HC) 2.5 % rectal cream; Place 1 application rectally 2 (two) times daily.  Dispense: 30 g; Refill: 0  6. Need for influenza vaccination - Flu Vaccine QUAD 36+ mos IM   Halina Maidens, MD [facility]  08/08/2016

## 2016-08-09 LAB — COMPREHENSIVE METABOLIC PANEL
ALK PHOS: 65 IU/L (ref 39–117)
ALT: 11 IU/L (ref 0–44)
AST: 18 IU/L (ref 0–40)
Albumin/Globulin Ratio: 2.4 — ABNORMAL HIGH (ref 1.2–2.2)
Albumin: 4.6 g/dL (ref 3.5–4.8)
BILIRUBIN TOTAL: 0.8 mg/dL (ref 0.0–1.2)
BUN/Creatinine Ratio: 19 (ref 10–24)
BUN: 24 mg/dL (ref 8–27)
CALCIUM: 9.3 mg/dL (ref 8.6–10.2)
CHLORIDE: 102 mmol/L (ref 96–106)
CO2: 27 mmol/L (ref 18–29)
CREATININE: 1.26 mg/dL (ref 0.76–1.27)
GFR calc Af Amer: 62 mL/min/{1.73_m2} (ref >59)
GFR calc non Af Amer: 54 mL/min/{1.73_m2} — ABNORMAL LOW (ref >59)
Globulin, Total: 1.9 g/dL (ref 1.5–4.5)
Glucose: 133 mg/dL — ABNORMAL HIGH (ref 65–99)
Potassium: 5 mmol/L (ref 3.5–5.2)
Sodium: 141 mmol/L (ref 134–144)
TOTAL PROTEIN: 6.5 g/dL (ref 6.0–8.5)

## 2016-08-09 LAB — HEMOGLOBIN A1C
ESTIMATED AVERAGE GLUCOSE: 160 mg/dL
HEMOGLOBIN A1C: 7.2 % — AB (ref 4.8–5.6)

## 2016-08-09 LAB — TSH: TSH: 2.95 u[IU]/mL (ref 0.450–4.500)

## 2016-08-23 ENCOUNTER — Other Ambulatory Visit: Payer: Self-pay | Admitting: Internal Medicine

## 2016-08-31 ENCOUNTER — Other Ambulatory Visit: Payer: Self-pay | Admitting: Internal Medicine

## 2016-09-06 ENCOUNTER — Encounter: Payer: Self-pay | Admitting: Internal Medicine

## 2016-09-06 ENCOUNTER — Other Ambulatory Visit: Payer: Self-pay | Admitting: Internal Medicine

## 2016-09-06 ENCOUNTER — Ambulatory Visit (INDEPENDENT_AMBULATORY_CARE_PROVIDER_SITE_OTHER): Payer: Medicare Other | Admitting: Internal Medicine

## 2016-09-06 VITALS — BP 118/78 | HR 60 | Resp 16 | Ht 74.0 in | Wt 180.0 lb

## 2016-09-06 DIAGNOSIS — I2510 Atherosclerotic heart disease of native coronary artery without angina pectoris: Secondary | ICD-10-CM | POA: Diagnosis not present

## 2016-09-06 DIAGNOSIS — R42 Dizziness and giddiness: Secondary | ICD-10-CM

## 2016-09-06 DIAGNOSIS — R0989 Other specified symptoms and signs involving the circulatory and respiratory systems: Secondary | ICD-10-CM | POA: Diagnosis not present

## 2016-09-06 DIAGNOSIS — R1900 Intra-abdominal and pelvic swelling, mass and lump, unspecified site: Secondary | ICD-10-CM

## 2016-09-06 DIAGNOSIS — I10 Essential (primary) hypertension: Secondary | ICD-10-CM

## 2016-09-06 DIAGNOSIS — Z87891 Personal history of nicotine dependence: Secondary | ICD-10-CM

## 2016-09-06 DIAGNOSIS — I739 Peripheral vascular disease, unspecified: Secondary | ICD-10-CM | POA: Diagnosis not present

## 2016-09-06 DIAGNOSIS — Z8249 Family history of ischemic heart disease and other diseases of the circulatory system: Secondary | ICD-10-CM

## 2016-09-06 MED ORDER — MECLIZINE HCL 25 MG PO TABS: 25.0000 mg | ORAL_TABLET | Freq: Every day | ORAL | 0 refills | Status: DC

## 2016-09-06 NOTE — Progress Notes (Signed)
Date:  09/06/2016   Name:  Terrance Carrillo   DOB:  29-Oct-1934   MRN:  [id number]   Chief Complaint: Dizziness (Started a week ago. Off balance when he wakes up. ) and Extremity Weakness (Very tired and weak)  Dizziness  This is a new problem. The current episode started in the past 7 days. The problem occurs intermittently. The problem has been unchanged. Associated symptoms include abdominal pain, headaches, myalgias, vertigo and weakness. Pertinent negatives include no chest pain, chills, congestion, coughing or fatigue. He has tried nothing for the symptoms.  Extremity Weakness   The pain is present in the right upper leg and left upper leg. This is a new problem. The current episode started 1 to 4 weeks ago. The problem occurs daily. The problem has been unchanged (with exertion, starts in abdomen with shortness of breath then goes to both thighs and feels like heaviness and weakness.). The quality of the pain is described as aching. The patient is experiencing no pain.     Review of Systems  Constitutional: Negative for chills and fatigue.  HENT: Negative for congestion.   Respiratory: Positive for shortness of breath. Negative for cough, chest tightness and wheezing.   Cardiovascular: Negative for chest pain, palpitations and leg swelling.  Gastrointestinal: Positive for abdominal pain. Negative for constipation and diarrhea.  Musculoskeletal: Positive for extremity weakness, gait problem and myalgias.  Neurological: Positive for dizziness, vertigo, weakness and headaches. Negative for tremors and syncope.    Patient Active Problem List   Diagnosis Date Noted  . BPH with obstruction/lower urinary tract symptoms 01/02/2016  . Recurrent UTI 01/02/2016  . Hiatal hernia 12/09/2015  . Irritable bowel syndrome (IBS) 12/09/2015  . Essential hypertension 12/09/2015  . Idiopathic insomnia 06/30/2015  . Local edema 06/30/2015  . Cardiomyopathy, ischemic 06/30/2015  . History of  prolonged Q-T interval on ECG 06/30/2015  . Acid reflux 06/30/2015  . Allergic state 06/30/2015  . Dyslipidemia 06/30/2015  . Chronic diastolic heart failure ([place]) 06/30/2015  . Acquired hypothyroidism 06/30/2015  . Other long term (current) drug therapy 06/05/2012  . MI (mitral incompetence) 01/24/2012  . Diabetes mellitus without complication ([place]) [id number]  . CCF (congestive cardiac failure) ([place]) 10/11/2011  . Congestive heart failure ([place]) 10/11/2011  . Paroxysmal ventricular tachycardia ([place]) 08/23/2011  . Arteriosclerosis of coronary artery 08/23/2011  . Automatic implantable cardioverter-defibrillator in situ 08/16/2011    Prior to Admission medications   Medication Sig Start Date End Date Taking? Authorizing Provider  aspirin 81 MG tablet Take by mouth. 11/16/10  Yes Historical Provider, MD  furosemide (LASIX) 20 MG tablet TAKE (1) TABLET BY MOUTH EVERY DAY 08/02/16  Yes Glean Hess, MD  glimepiride (AMARYL) 2 MG tablet TAKE (1) TABLET BY MOUTH EVERY DAY 08/23/16  Yes Glean Hess, MD  hydrocortisone (ANUSOL-HC) 2.5 % rectal cream Place 1 application rectally 2 (two) times daily. 08/08/16  Yes Glean Hess, MD  levothyroxine (SYNTHROID, LEVOTHROID) 25 MCG tablet TAKE 1 TABLET ON AN EMPTY STOMACH WITH A GLASS OF WATER AT LEAST 30 TO 60 MINUTES BEFORE BREAKFAST. 12/28/15  Yes Glean Hess, MD  lisinopril (PRINIVIL,ZESTRIL) 5 MG tablet Take by mouth daily. 08/29/15  Yes Historical Provider, MD  magnesium oxide (MAG-OX) 400 MG tablet Take by mouth. 12/28/15 12/27/16 Yes Historical Provider, MD  metFORMIN (GLUCOPHAGE) 500 MG tablet Take 1 tablet (500 mg total) by mouth 2 (two) times daily with a meal. 08/08/16  Yes Glean Hess, MD  metoprolol (LOPRESSOR) 50 MG tablet Take 0.5 tablets by mouth 2 (two) times daily.  01/11/15  Yes Historical Provider, MD  Multiple Vitamin (MULTI-VITAMINS) TABS Take by mouth. Reported on 01/16/2016   Yes Historical Provider, MD  potassium  chloride SA (K-DUR,KLOR-CON) 20 MEQ tablet TAKE ONE (1) TABLET BY MOUTH ONCE DAILY 03/30/16  Yes Glean Hess, MD  simvastatin (ZOCOR) 40 MG tablet TAKE (1) TABLET BY MOUTH DAILY AT BEDTIME 08/31/16  Yes Glean Hess, MD  sotalol (BETAPACE) 120 MG tablet Take by mouth. 07/04/16  Yes Historical Provider, MD    Allergies  Allergen Reactions  . Penicillins Rash  . Sulfa Antibiotics Rash    Full body rash, itching, face turns bright red    Past Surgical History:  Procedure Laterality Date  . CARDIAC DEFIBRILLATOR PLACEMENT  2014   PSVT  . CATARACT EXTRACTION, BILATERAL    . CORONARY ARTERY BYPASS GRAFT  2011   x3    Social History  Substance Use Topics  . Smoking status: Never Smoker  . Smokeless tobacco: Never Used  . Alcohol use No     Medication list has been reviewed and updated.   Physical Exam  Constitutional: He is oriented to person, place, and time. He appears well-developed. No distress.  HENT:  Head: Normocephalic and atraumatic.  Right Ear: Tympanic membrane and ear canal normal.  Left Ear: Tympanic membrane and ear canal normal.  Nose: Right sinus exhibits no maxillary sinus tenderness. Left sinus exhibits no maxillary sinus tenderness.  Mouth/Throat: Posterior oropharyngeal erythema present. No oropharyngeal exudate.  Neck: Carotid bruit is not present.  Cardiovascular: Normal rate, regular rhythm and normal heart sounds.   Pulses:      Femoral pulses are 1+ on the right side, and 1+ on the left side.      Popliteal pulses are 0 on the right side, and 0 on the left side.       Dorsalis pedis pulses are 0 on the right side, and 0 on the left side.       Posterior tibial pulses are 0 on the right side, and 0 on the left side.  Pulmonary/Chest: Effort normal and breath sounds normal. No respiratory distress.  Abdominal: Normal appearance and bowel sounds are normal. He exhibits pulsatile midline mass. He exhibits no abdominal bruit and no ascites. There is  tenderness in the epigastric area.  Musculoskeletal: Normal range of motion.  Neurological: He is alert and oriented to person, place, and time.  Skin: Skin is warm, dry and intact. No rash noted. No cyanosis.  Psychiatric: He has a normal mood and affect. His behavior is normal. Thought content normal.  Nursing note and vitals reviewed.   BP 118/78   Pulse 60   Resp 16   Ht 6\' 2"  (1.88 m)   Wt 180 lb (81.6 kg)   SpO2 98%   BMI 23.11 kg/m   Assessment and Plan: 1. Claudication of lower extremity ([place]) - Ambulatory referral to Vascular Surgery  2. Prominent abdominal aortic pulsation Needs [place] - will try to get correct dx for coverage  3. Vertigo No evidence of infection - meclizine (ANTIVERT) 25 MG tablet; Take 1 tablet (25 mg total) by mouth at bedtime.  Dispense: 15 tablet; Refill: 0  4. Arteriosclerosis of coronary artery Recent Cath and Myoview without lesion amenable to intervention   Halina Maidens, MD [facility]  09/06/2016

## 2016-09-11 ENCOUNTER — Ambulatory Visit
Admission: RE | Admit: 2016-09-11 | Discharge: 2016-09-11 | Disposition: A | Discharge status: Home / Self Care | Payer: Medicare Other | Source: Ambulatory Visit | Attending: Internal Medicine | Admitting: Internal Medicine

## 2016-09-11 DIAGNOSIS — R1900 Intra-abdominal and pelvic swelling, mass and lump, unspecified site: Secondary | ICD-10-CM | POA: Insufficient documentation

## 2016-09-11 DIAGNOSIS — N281 Cyst of kidney, acquired: Secondary | ICD-10-CM | POA: Diagnosis not present

## 2016-09-11 DIAGNOSIS — I2510 Atherosclerotic heart disease of native coronary artery without angina pectoris: Secondary | ICD-10-CM | POA: Diagnosis not present

## 2016-09-11 DIAGNOSIS — I700 Atherosclerosis of aorta: Secondary | ICD-10-CM | POA: Insufficient documentation

## 2016-09-11 DIAGNOSIS — I10 Essential (primary) hypertension: Secondary | ICD-10-CM

## 2016-09-11 DIAGNOSIS — Z8249 Family history of ischemic heart disease and other diseases of the circulatory system: Secondary | ICD-10-CM | POA: Diagnosis not present

## 2016-09-24 ENCOUNTER — Other Ambulatory Visit: Payer: Self-pay | Admitting: Internal Medicine

## 2016-09-25 ENCOUNTER — Telehealth: Payer: Self-pay | Admitting: Internal Medicine

## 2016-09-25 NOTE — Telephone Encounter (Signed)
Patient called to let you know they canceled there [facility] Appointment for 11/28 that you made.

## 2016-10-08 IMAGING — US US RENAL
1 series · 14 of 25 positions shown · non-contrast
Comparison: None

CLINICAL DATA: Recurrent UTIs, diabetes mellitus, hypertension,
heart failure

EXAM:
RENAL / URINARY TRACT ULTRASOUND COMPLETE

[Series 1: us renal · 0.25mm/px · 14 of 41 slices shown]
[im 1/41]
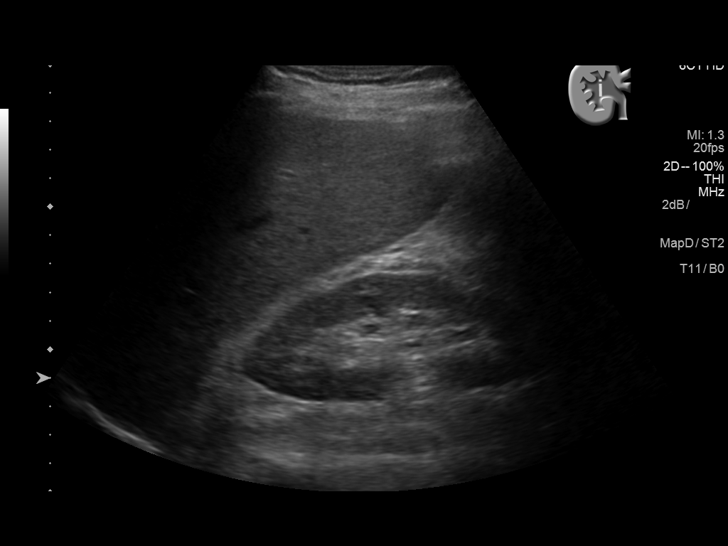
[im 4/41]
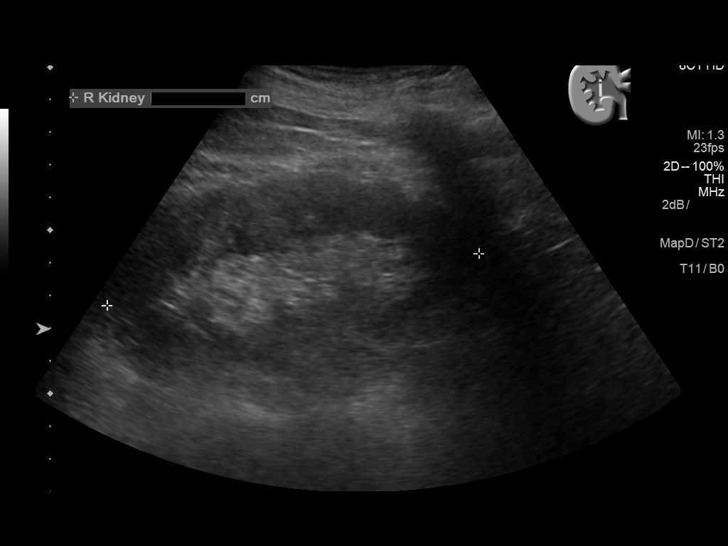
[im 7/41]
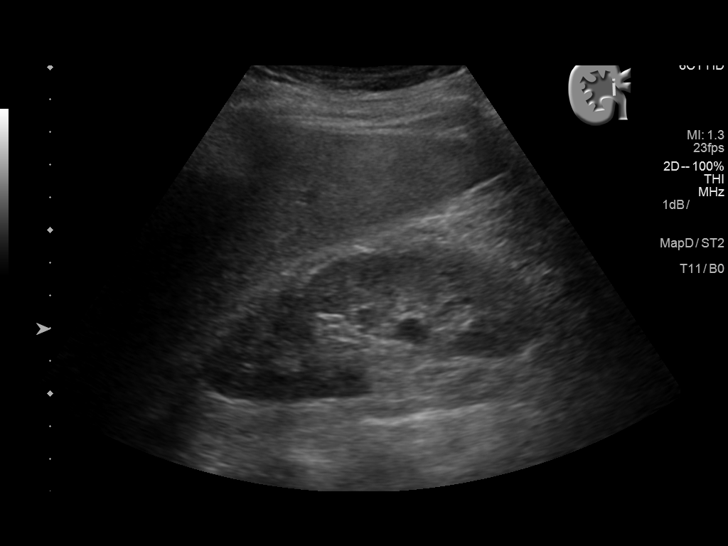
[im 11/41]
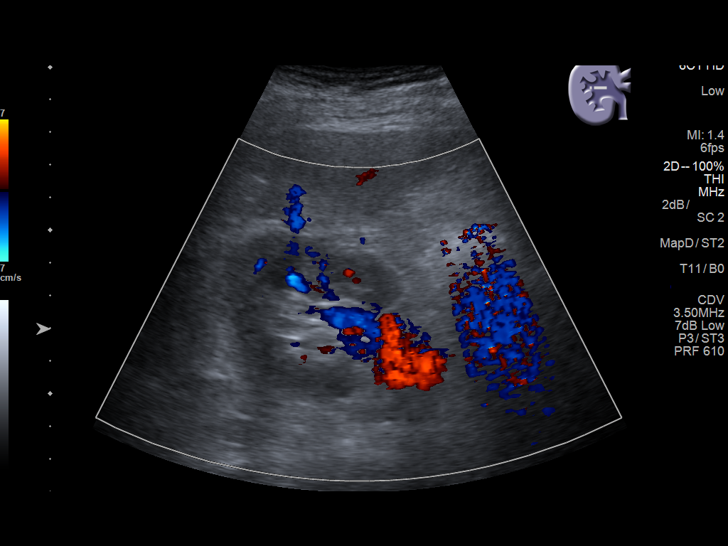
[im 14/41]
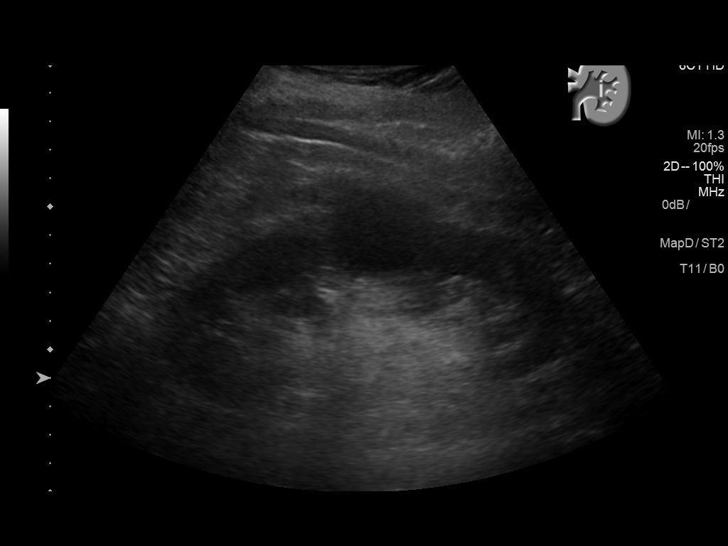
[im 16/41]
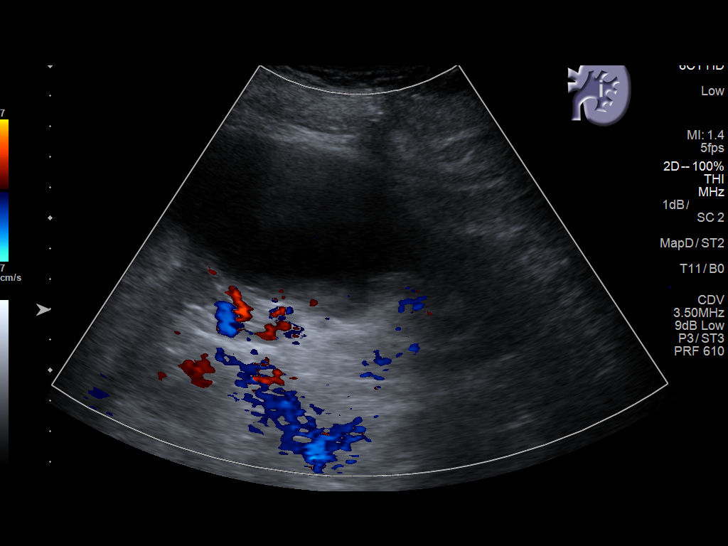
[im 19/41]
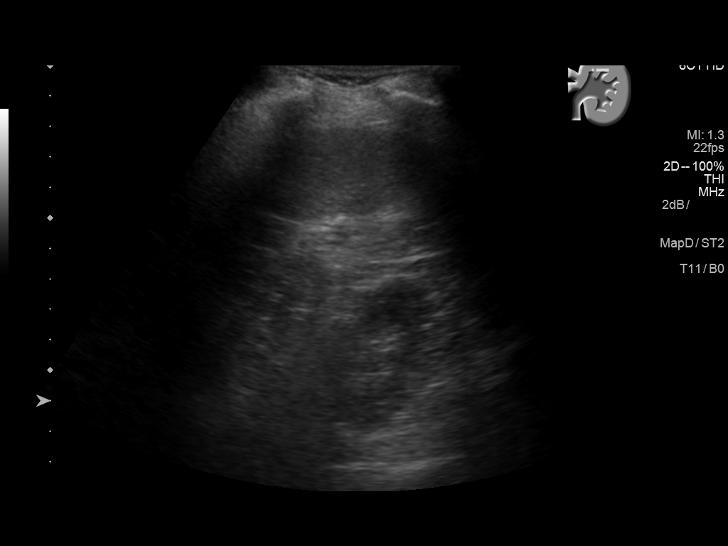
[im 22/41]
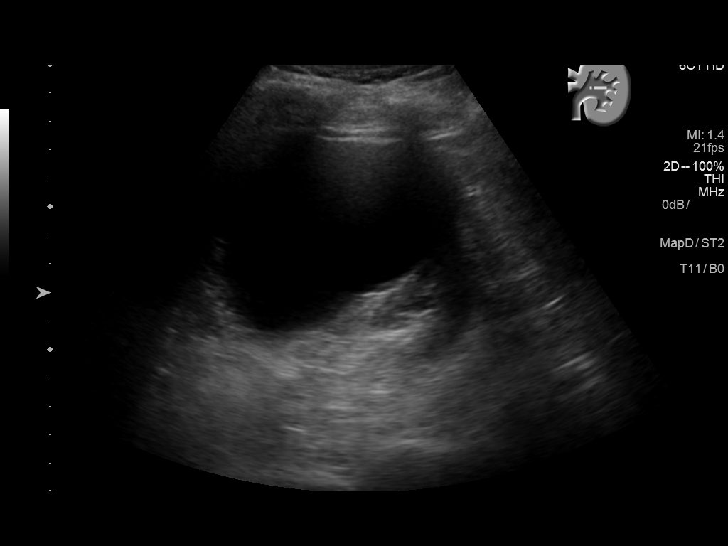
[im 26/41]
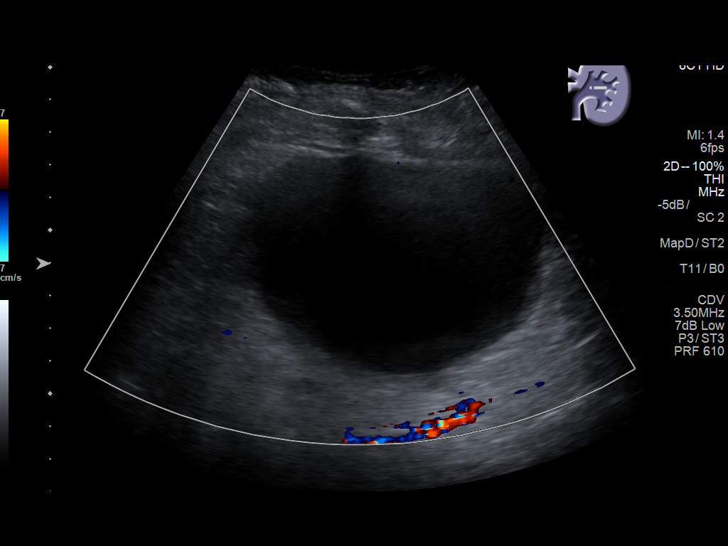
[im 27/41]
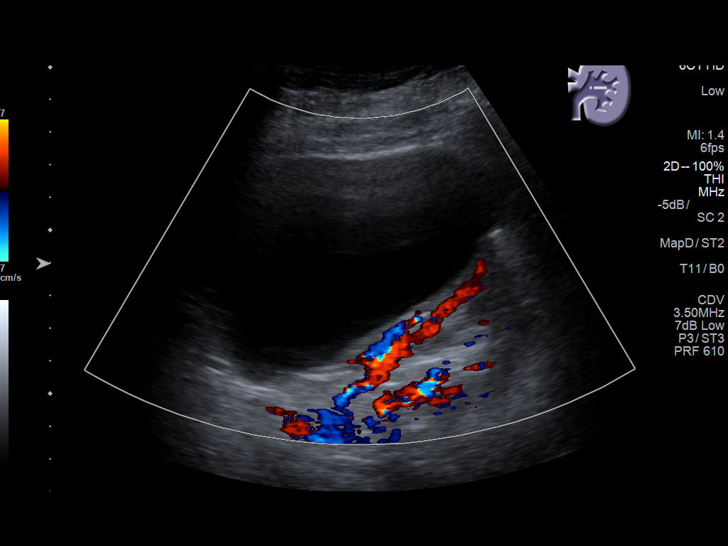
[im 31/41]
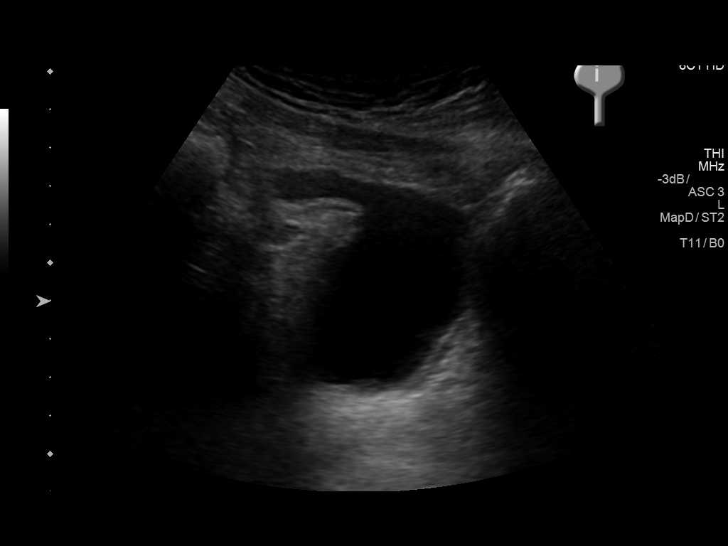
[im 34/41]
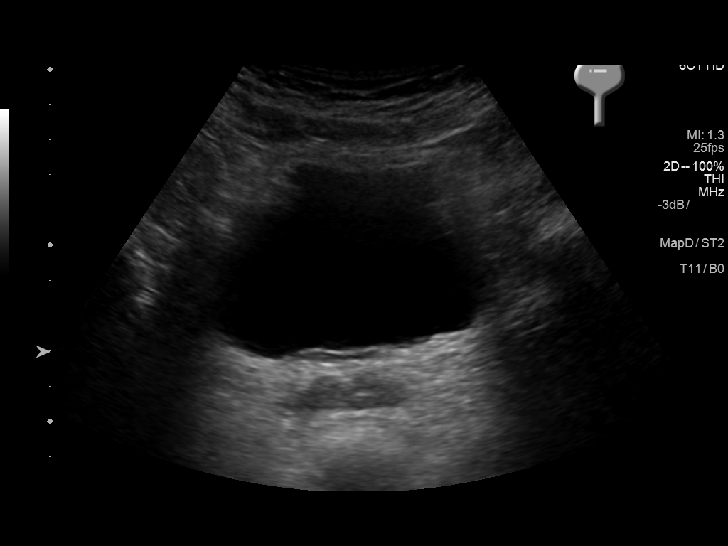
[im 37/41]
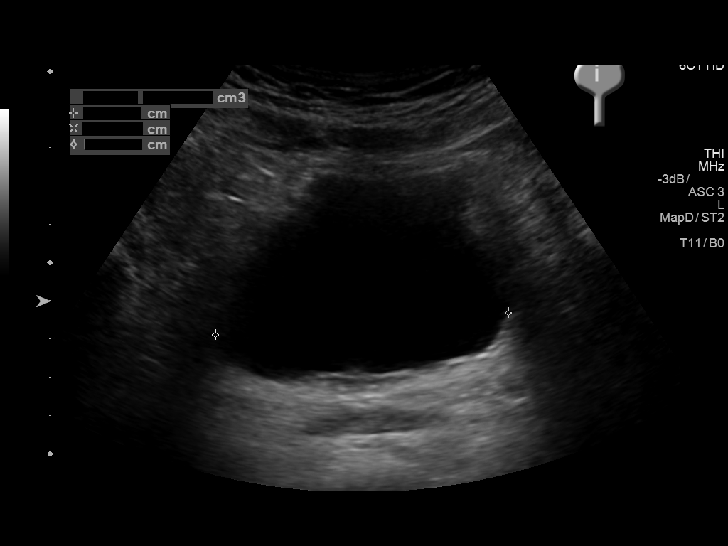
[im 41/41]
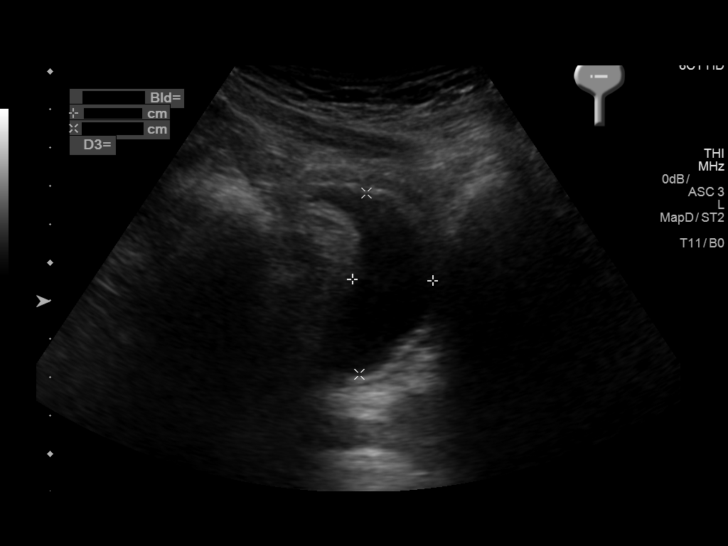

[14 of 25 positions shown; findings below may reference images not displayed]

FINDINGS: Right Kidney:

Length: 11.8 cm. Normal cortical thickness and echogenicity. No
mass, hydronephrosis or shadowing calcification.

Left Kidney:

Length: 14.8 cm. Normal cortical thickness and echogenicity. Large
cyst mid LEFT kidney 9.7 x 6.7 x 8.5 cm. No additional mass,
hydronephrosis or shadowing calcification.

Bladder:

Partially distended, grossly normal appearance.

Ureteral jets were not visualized.

Calculated prevoid volume 124 mL with postvoid residual of 20 mL.
IMPRESSION: Large LEFT renal cyst.

Small bladder postvoid residual volume.

Otherwise negative renal ultrasound.

## 2016-12-13 ENCOUNTER — Encounter: Payer: Self-pay | Admitting: Internal Medicine

## 2016-12-13 ENCOUNTER — Ambulatory Visit (INDEPENDENT_AMBULATORY_CARE_PROVIDER_SITE_OTHER): Payer: Medicare Other | Admitting: Internal Medicine

## 2016-12-13 VITALS — BP 120/60 | HR 64 | Ht 74.0 in | Wt 184.0 lb

## 2016-12-13 DIAGNOSIS — I739 Peripheral vascular disease, unspecified: Secondary | ICD-10-CM

## 2016-12-13 DIAGNOSIS — E119 Type 2 diabetes mellitus without complications: Secondary | ICD-10-CM | POA: Diagnosis not present

## 2016-12-13 DIAGNOSIS — E039 Hypothyroidism, unspecified: Secondary | ICD-10-CM | POA: Diagnosis not present

## 2016-12-13 DIAGNOSIS — I5032 Chronic diastolic (congestive) heart failure: Secondary | ICD-10-CM

## 2016-12-13 DIAGNOSIS — K580 Irritable bowel syndrome with diarrhea: Secondary | ICD-10-CM | POA: Diagnosis not present

## 2016-12-13 MED ORDER — HYOSCYAMINE SULFATE 0.125 MG PO TABS: 0.1250 mg | ORAL_TABLET | Freq: Four times a day (QID) | ORAL | 0 refills | Status: DC | PRN

## 2016-12-13 NOTE — Progress Notes (Signed)
Date:  12/13/2016   Name:  Terrance Carrillo   DOB:  April 17, 1934   MRN:  [id number]   Chief Complaint: Follow-up ("check up") Diabetes  He presents for his follow-up diabetic visit. He has type 2 diabetes mellitus. His disease course has been stable. Pertinent negatives for hypoglycemia include no headaches. Pertinent negatives for diabetes include no chest pain, no fatigue, no foot paresthesias, no polyphagia, no polyuria, no weakness and no weight loss. His breakfast blood glucose is taken between 6-7 am. His breakfast blood glucose range is generally 130-140 mg/dl.  Hypertension  This is a chronic problem. The problem is unchanged. The problem is controlled. Associated symptoms include shortness of breath. Pertinent negatives include no chest pain, headaches or palpitations. The current treatment provides significant improvement.   IBS - has intermittent constipation and diarrhea.  It is improved since decreasing dose of metformin.  He still gets bloating and has belching and flatus. He tried Bentyl with no benefit.  PVD - minimal leg sx.  Not walking as much due to fatigue.  No leg or foot ulcers or pain.  Lab Results  Component Value Date   TSH 2.950 08/08/2016    Review of Systems  Constitutional: Negative for chills, fatigue, fever, unexpected weight change and weight loss.  Eyes: Negative for visual disturbance.  Respiratory: Positive for shortness of breath. Negative for cough, chest tightness and wheezing.   Cardiovascular: Negative for chest pain, palpitations and leg swelling.  Gastrointestinal: Positive for abdominal distention, constipation and diarrhea. Negative for rectal pain and vomiting.  Endocrine: Negative for polyphagia and polyuria.  Neurological: Negative for weakness and headaches.  Psychiatric/Behavioral: Negative for dysphoric mood and sleep disturbance.    Patient Active Problem List   Diagnosis Date Noted  . Claudication of lower extremity ([place]) 09/06/2016    . BPH with obstruction/lower urinary tract symptoms 01/02/2016  . Recurrent UTI 01/02/2016  . Hiatal hernia 12/09/2015  . Irritable bowel syndrome (IBS) 12/09/2015  . Essential hypertension 12/09/2015  . Idiopathic insomnia 06/30/2015  . Local edema 06/30/2015  . Cardiomyopathy, ischemic 06/30/2015  . History of prolonged Q-T interval on ECG 06/30/2015  . Acid reflux 06/30/2015  . Allergic state 06/30/2015  . Dyslipidemia 06/30/2015  . Chronic diastolic heart failure ([place]) 06/30/2015  . Acquired hypothyroidism 06/30/2015  . Other long term (current) drug therapy 06/05/2012  . MI (mitral incompetence) 01/24/2012  . Diabetes mellitus without complication ([place]) [id number]  . Paroxysmal ventricular tachycardia ([place]) 08/23/2011  . Arteriosclerosis of coronary artery 08/23/2011  . Automatic implantable cardioverter-defibrillator in situ 08/16/2011    Prior to Admission medications   Medication Sig Start Date End Date Taking? Authorizing Provider  aspirin 81 MG tablet Take by mouth. 11/16/10  Yes Historical Provider, MD  furosemide (LASIX) 20 MG tablet TAKE (1) TABLET BY MOUTH EVERY DAY 08/02/16  Yes Glean Hess, MD  glimepiride (AMARYL) 2 MG tablet TAKE (1) TABLET BY MOUTH EVERY DAY 08/23/16  Yes Glean Hess, MD  levothyroxine (SYNTHROID, LEVOTHROID) 25 MCG tablet TAKE 1 TABLET ON AN EMPTY STOMACH WITH A GLASS OF WATER AT LEAST 30 TO 60 MINUTES BEFORE BREAKFAST. 12/28/15  Yes Glean Hess, MD  lisinopril (PRINIVIL,ZESTRIL) 5 MG tablet Take by mouth daily. 08/29/15  Yes Historical Provider, MD  magnesium oxide (MAG-OX) 400 MG tablet Take by mouth. 12/28/15 12/27/16 Yes Historical Provider, MD  metFORMIN (GLUCOPHAGE) 500 MG tablet Take 1 tablet (500 mg total) by mouth 2 (two) times daily with a meal.  08/08/16  Yes Glean Hess, MD  metoprolol (LOPRESSOR) 50 MG tablet Take 0.5 tablets by mouth 2 (two) times daily.  01/11/15  Yes Historical Provider, MD  Multiple Vitamin  (MULTI-VITAMINS) TABS Take by mouth. Reported on 01/16/2016   Yes Historical Provider, MD  potassium chloride SA (K-DUR,KLOR-CON) 20 MEQ tablet TAKE (1) TABLET BY MOUTH EVERY DAY 09/24/16  Yes Glean Hess, MD  simvastatin (ZOCOR) 40 MG tablet TAKE (1) TABLET BY MOUTH DAILY AT BEDTIME 08/31/16  Yes Glean Hess, MD  sotalol (BETAPACE) 120 MG tablet Take by mouth. 07/04/16  Yes Historical Provider, MD  hydrocortisone (ANUSOL-HC) 2.5 % rectal cream Place 1 application rectally 2 (two) times daily. Patient not taking: Reported on 12/13/2016 08/08/16   Glean Hess, MD  meclizine (ANTIVERT) 25 MG tablet Take 1 tablet (25 mg total) by mouth at bedtime. Patient not taking: Reported on 12/13/2016 09/06/16   Glean Hess, MD    Allergies  Allergen Reactions  . Penicillins Rash  . Sulfa Antibiotics Rash    Full body rash, itching, face turns bright red    Past Surgical History:  Procedure Laterality Date  . CARDIAC DEFIBRILLATOR PLACEMENT  2014   PSVT  . CATARACT EXTRACTION, BILATERAL    . CORONARY ARTERY BYPASS GRAFT  2011   x3    Social History  Substance Use Topics  . Smoking status: Never Smoker  . Smokeless tobacco: Never Used  . Alcohol use No     Medication list has been reviewed and updated.   Physical Exam  Constitutional: He is oriented to person, place, and time. He appears well-developed. No distress.  HENT:  Head: Normocephalic and atraumatic.  Neck: Normal range of motion. Carotid bruit is not present.  Cardiovascular: Normal rate, regular rhythm and normal heart sounds.   Pulmonary/Chest: Effort normal and breath sounds normal. No respiratory distress. He has no wheezes. He has no rales.  Abdominal: Soft. Bowel sounds are normal. He exhibits no distension. There is tenderness (mild diffuse).  Musculoskeletal: He exhibits no edema.  Neurological: He is alert and oriented to person, place, and time.  Skin: Skin is warm and dry. No rash noted.  Psychiatric:  He has a normal mood and affect. His behavior is normal. Thought content normal.  Nursing note and vitals reviewed.   BP 120/60   Pulse 64   Ht 6\' 2"  (579FGE m)   Wt 184 lb (83.5 kg)   BMI 23.62 kg/m   Assessment and Plan: 1. Diabetes mellitus without complication ([place]) Continue current therapy - Hemoglobin A1c  2. Irritable bowel syndrome with diarrhea Will try Levsin May need GI referral but patient declines at this time - hyoscyamine (LEVSIN, ANASPAZ) 0.125 MG tablet; Take 1 tablet (0.125 mg total) by mouth every 6 (six) hours as needed.  Dispense: 30 tablet; Refill: 0  3. Chronic diastolic heart failure ([place]) Stable; continue Cardiology follow up - Basic metabolic panel  4. Acquired hypothyroidism supplemented  5. Claudication of lower extremity ([place]) stable   Halina Maidens, MD [facility]  12/13/2016

## 2016-12-14 LAB — BASIC METABOLIC PANEL
BUN/Creatinine Ratio: 17 (ref 10–24)
BUN: 23 mg/dL (ref 8–27)
CHLORIDE: 101 mmol/L (ref 96–106)
CO2: 26 mmol/L (ref 18–29)
Calcium: 9.3 mg/dL (ref 8.6–10.2)
Creatinine, Ser: 1.32 mg/dL — ABNORMAL HIGH (ref 0.76–1.27)
GFR calc Af Amer: 58 mL/min/{1.73_m2} — ABNORMAL LOW (ref >59)
GFR calc non Af Amer: 51 mL/min/{1.73_m2} — ABNORMAL LOW (ref >59)
Glucose: 185 mg/dL — ABNORMAL HIGH (ref 65–99)
POTASSIUM: 4.8 mmol/L (ref 3.5–5.2)
Sodium: 142 mmol/L (ref 134–144)

## 2016-12-14 LAB — HEMOGLOBIN A1C
ESTIMATED AVERAGE GLUCOSE: 206 mg/dL
Hgb A1c MFr Bld: 8.8 % — ABNORMAL HIGH (ref 4.8–5.6)

## 2016-12-17 ENCOUNTER — Other Ambulatory Visit: Payer: Self-pay | Admitting: Internal Medicine

## 2016-12-17 MED ORDER — GLIMEPIRIDE 4 MG PO TABS: 4.0000 mg | ORAL_TABLET | Freq: Every day | ORAL | 3 refills | Status: DC

## 2017-01-10 ENCOUNTER — Other Ambulatory Visit: Payer: Self-pay | Admitting: Internal Medicine

## 2017-01-15 ENCOUNTER — Ambulatory Visit: Payer: Medicare Other | Admitting: Urology

## 2017-01-15 NOTE — Progress Notes (Signed)
10:04 AM   Terrance Carrillo 1934/09/10 [id number]  Referring provider: Glean Hess, MD [address] [place],  [address]  Chief Complaint  Patient presents with  . Benign Prostatic Hypertrophy    9 month follow up   . Nephrolithiasis    HPI: Patient is a 83 year old Caucasian male who presents today for 12 month follow-up for nephrolithiasis and BPH with LU TS.    Nephrolithiasis He does have a prior history of nephrolithiasis several years ago.  He did pass that stone spontaneously.  RUS from 01/12/2016 noted a large left renal cyst and a small bladder postvoid residual volume.  KUB from 01/16/2016 noted suspected bilateral nonobstructing nephrolithiasis.  KUB taken on 01/16/2017 noted no definitive renal calculi are seen.  I have independently reviewed the films.    BPH WITH LUTS His IPSS score today is 7, which is mild lower urinary tract symptomatology. He is pleased with his quality life due to his urinary symptoms.  His previous IPSS score was 5/1.  His previous PVR is 0 mL.   His major complaint today nocturia and frequency and he attributes this to Lasix.  He has had these symptoms for the last few years.  He denies any dysuria, hematuria or suprapubic pain.  He also denies any recent fevers, chills, nausea or vomiting.        IPSS    Row Name 01/16/17 0900         International Prostate Symptom Score   How often have you had the sensation of not emptying your bladder? Less than 1 in 5     How often have you had to urinate less than every two hours? Less than 1 in 5 times     How often have you found you stopped and started again several times when you urinated? Not at All     How often have you found it difficult to postpone urination? Not at All     How often have you had a weak urinary stream? Less than 1 in 5 times     How often have you had to strain to start urination? Not at All     How many times did you typically get up at night to  urinate? 4 Times     Total IPSS Score 7       Quality of Life due to urinary symptoms   If you were to spend the rest of your life with your urinary condition just the way it is now how would you feel about that? Pleased        Score:  1-7 Mild 8-19 Moderate 20-35 Severe  PMH: Past Medical History:  Diagnosis Date  . Diabetes ([place])   . Heart attack   . Heart disease   . Heart failure ([place])   . Heartburn   . HLD (hyperlipidemia)   . HTN (hypertension)   . Hypothyroidism   . Urge incontinence     Surgical History: Past Surgical History:  Procedure Laterality Date  . CARDIAC DEFIBRILLATOR PLACEMENT  2014   PSVT  . CATARACT EXTRACTION, BILATERAL    . CORONARY ARTERY BYPASS GRAFT  2011   x3    Home Medications:  Allergies as of 01/16/2017      Reactions   Penicillins Rash   Sulfa Antibiotics Rash   Full body rash, itching, face turns bright red      Medication List       Accurate  as of 01/16/17 10:04 AM. Always use your most recent med list.          aspirin 81 MG tablet Take by mouth.   dicyclomine 10 MG capsule Commonly known as:  BENTYL Take by mouth.   furosemide 20 MG tablet Commonly known as:  LASIX TAKE (1) TABLET BY MOUTH EVERY DAY   glimepiride 4 MG tablet Commonly known as:  AMARYL Take 1 tablet (4 mg total) by mouth daily with breakfast.   hyoscyamine 0.125 MG tablet Commonly known as:  LEVSIN, ANASPAZ Take 1 tablet (0.125 mg total) by mouth every 6 (six) hours as needed.   levothyroxine 25 MCG tablet Commonly known as:  SYNTHROID, LEVOTHROID TAKE 1 TABLET ON AN EMPTY STOMACH WITH A GLASS OF WATER AT LEAST 30 TO 60 MINUTES BEFORE BREAKFAST.   lisinopril 5 MG tablet Commonly known as:  PRINIVIL,ZESTRIL Take by mouth daily.   metFORMIN 500 MG tablet Commonly known as:  GLUCOPHAGE Take 1 tablet (500 mg total) by mouth 2 (two) times daily with a meal.   metoprolol 50 MG tablet Commonly known as:  LOPRESSOR Take 0.5 tablets by mouth  2 (two) times daily.   MULTI-VITAMINS Tabs Take by mouth. Reported on 01/16/2016   potassium chloride SA 20 MEQ tablet Commonly known as:  K-DUR,KLOR-CON TAKE (1) TABLET BY MOUTH EVERY DAY   simvastatin 40 MG tablet Commonly known as:  ZOCOR TAKE (1) TABLET BY MOUTH DAILY AT BEDTIME   sotalol 120 MG tablet Commonly known as:  BETAPACE Take by mouth.       Allergies:  Allergies  Allergen Reactions  . Penicillins Rash  . Sulfa Antibiotics Rash    Full body rash, itching, face turns bright red    Family History: Family History  Problem Relation Age of Onset  . Melanoma Father   . Kidney disease Neg Hx   . Prostate cancer Neg Hx   . Bladder Cancer Neg Hx   . Kidney cancer Neg Hx     Social History:  reports that he has never smoked. He has never used smokeless tobacco. He reports that he does not drink alcohol or use drugs.  ROS: UROLOGY Frequent Urination?: No Hard to postpone urination?: No Burning/pain with urination?: No Get up at night to urinate?: No Leakage of urine?: No Urine stream starts and stops?: No Trouble starting stream?: No Do you have to strain to urinate?: No Blood in urine?: No Urinary tract infection?: No Sexually transmitted disease?: No Injury to kidneys or bladder?: No Painful intercourse?: No Weak stream?: No Erection problems?: No Penile pain?: No  Gastrointestinal Nausea?: No Vomiting?: No Indigestion/heartburn?: No Diarrhea?: No Constipation?: No  Constitutional Fever: No Night sweats?: No Weight loss?: No Fatigue?: No  Skin Skin rash/lesions?: No Itching?: No  Eyes Blurred vision?: No Double vision?: No  Ears/Nose/Throat Sore throat?: No Sinus problems?: No  Hematologic/Lymphatic Swollen glands?: No Easy bruising?: No  Cardiovascular Leg swelling?: No Chest pain?: No  Respiratory Cough?: No Shortness of breath?: No  Endocrine Excessive thirst?: No  Musculoskeletal Back pain?: No Joint pain?:  No  Neurological Headaches?: No Dizziness?: No  Psychologic Depression?: No Anxiety?: No  Physical Exam: BP 131/69   Pulse 64   Ht 6\' 2"  (1.88 m)   Wt 183 lb 4.8 oz (83.1 kg)   BMI 23.53 kg/m   Constitutional: Well nourished. Alert and oriented, No acute distress. HEENT: [place] AT, moist mucus membranes. Trachea midline, no masses. Cardiovascular: No clubbing, cyanosis, or edema.  Respiratory: Normal respiratory effort, no increased work of breathing. GI: Abdomen is soft, non tender, non distended, no abdominal masses. Liver and spleen not palpable.  No hernias appreciated.  Stool sample for occult testing is not indicated.   GU: No CVA tenderness.  No bladder fullness or masses.   Rectal: Deferred due to diarrhea Skin: No rashes, bruises or suspicious lesions. Lymph: No cervical or inguinal adenopathy. Neurologic: Grossly intact, no focal deficits, moving all 4 extremities. Psychiatric: Normal mood and affect.  Laboratory Data: Lab Results  Component Value Date   CREATININE 1.32 (H) 12/13/2016    Lab Results  Component Value Date   HGBA1C 8.8 (H) 12/13/2016    Lab Results  Component Value Date   TSH 2.950 08/08/2016    Results for orders placed or performed in visit on 44/96/75  Basic metabolic panel  Result Value Ref Range   Glucose 185 (H) 65 - 99 mg/dL   BUN 23 8 - 27 mg/dL   Creatinine, Ser 1.32 (H) 0.76 - 1.27 mg/dL   GFR calc non Af Amer 51 (L) >59 mL/min/1.73   GFR calc Af Amer 58 (L) >59 mL/min/1.73   BUN/Creatinine Ratio 17 10 - 24   Sodium 142 134 - 144 mmol/L   Potassium 4.8 3.5 - 5.2 mmol/L   Chloride 101 96 - 106 mmol/L   CO2 26 18 - 29 mmol/L   Calcium 9.3 8.6 - 10.2 mg/dL  Hemoglobin A1c  Result Value Ref Range   Hgb A1c MFr Bld 8.8 (H) 4.8 - 5.6 %   Est. average glucose Bld gHb Est-mCnc 206 mg/dL     Pertinent Imaging: CLINICAL DATA:  History of kidney stones  EXAM: ABDOMEN - 1 VIEW  COMPARISON:   05/11/2010  FINDINGS: Scattered large and small bowel gas is noted. There are tiny densities identified projecting more in the course of the transverse colon likely related to ingested material. No definitive renal or ureteral calculi are seen. Vascular calcifications are noted as well.  IMPRESSION: No definitive renal calculi are seen.   Electronically Signed   By: Inez Catalina M.D.   On: 01/16/2017 09:17  1. Bilateral nephrolithiasis  - KUB taken today did not identify any stones at this time  - RTC if experiences flank pain or gross hematuria    2. BPH with LUTS  - IPSS score is 7/1, it is worsening  - Continue conservative management, avoiding bladder irritants and timed voiding's  - RTC in 12 months for IPSS and exam    Return in about 1 year (around 01/16/2018) for IPSS and exam.  These notes generated with voice recognition software. I apologize for typographical errors.  Zara Council, [facility] [address] [place], [place] [address] [phone]

## 2017-01-16 ENCOUNTER — Ambulatory Visit
Admission: RE | Admit: 2017-01-16 | Discharge: 2017-01-16 | Disposition: A | Discharge status: Home / Self Care | Payer: Medicare Other | Source: Ambulatory Visit | Attending: Urology | Admitting: Urology

## 2017-01-16 ENCOUNTER — Ambulatory Visit (INDEPENDENT_AMBULATORY_CARE_PROVIDER_SITE_OTHER): Payer: Medicare Other | Admitting: Urology

## 2017-01-16 ENCOUNTER — Encounter: Payer: Self-pay | Admitting: Urology

## 2017-01-16 VITALS — BP 131/69 | HR 64 | Ht 74.0 in | Wt 183.3 lb

## 2017-01-16 DIAGNOSIS — N200 Calculus of kidney: Secondary | ICD-10-CM | POA: Diagnosis not present

## 2017-01-16 DIAGNOSIS — N138 Other obstructive and reflux uropathy: Secondary | ICD-10-CM

## 2017-01-16 DIAGNOSIS — N401 Enlarged prostate with lower urinary tract symptoms: Secondary | ICD-10-CM

## 2017-01-21 ENCOUNTER — Other Ambulatory Visit: Payer: Self-pay | Admitting: Internal Medicine

## 2017-01-21 DIAGNOSIS — E119 Type 2 diabetes mellitus without complications: Secondary | ICD-10-CM

## 2017-02-05 LAB — HM DIABETES EYE EXAM

## 2017-03-11 ENCOUNTER — Encounter: Payer: Self-pay | Admitting: Internal Medicine

## 2017-03-11 ENCOUNTER — Ambulatory Visit
Admission: RE | Admit: 2017-03-11 | Discharge: 2017-03-11 | Disposition: A | Discharge status: Home / Self Care | Payer: Medicare Other | Source: Ambulatory Visit | Attending: Internal Medicine | Admitting: Internal Medicine

## 2017-03-11 ENCOUNTER — Ambulatory Visit (INDEPENDENT_AMBULATORY_CARE_PROVIDER_SITE_OTHER): Payer: Medicare Other | Admitting: Internal Medicine

## 2017-03-11 VITALS — BP 118/66 | HR 58 | Temp 97.6°F | Ht 74.0 in | Wt 184.0 lb

## 2017-03-11 DIAGNOSIS — Z95810 Presence of automatic (implantable) cardiac defibrillator: Secondary | ICD-10-CM | POA: Insufficient documentation

## 2017-03-11 DIAGNOSIS — Z951 Presence of aortocoronary bypass graft: Secondary | ICD-10-CM | POA: Insufficient documentation

## 2017-03-11 DIAGNOSIS — I10 Essential (primary) hypertension: Secondary | ICD-10-CM

## 2017-03-11 DIAGNOSIS — I517 Cardiomegaly: Secondary | ICD-10-CM | POA: Insufficient documentation

## 2017-03-11 DIAGNOSIS — I5032 Chronic diastolic (congestive) heart failure: Secondary | ICD-10-CM | POA: Insufficient documentation

## 2017-03-11 DIAGNOSIS — T7840XD Allergy, unspecified, subsequent encounter: Secondary | ICD-10-CM

## 2017-03-11 DIAGNOSIS — R0602 Shortness of breath: Secondary | ICD-10-CM | POA: Insufficient documentation

## 2017-03-11 DIAGNOSIS — J984 Other disorders of lung: Secondary | ICD-10-CM | POA: Insufficient documentation

## 2017-03-11 NOTE — Patient Instructions (Signed)
Take  2 Lasix per day for three days - then call me to report how you are feeling  Take Xyzal 5 mg once a day for allergies

## 2017-03-11 NOTE — Progress Notes (Signed)
Date:  03/11/2017   Name:  Terrance Carrillo   DOB:  1934/02/08   MRN:  [id number]   Chief Complaint: Allergic Rhinitis  (and chest congestion. After going outside for 30 minutes starts feeling stopped up and sneezes. When laying down at night has wheezing in chest and has SOB. Has pressure feeling by ears and temporal lobes.  ) Shortness of Breath  This is a new problem. The current episode started in the past 7 days. The problem has been waxing and waning. Associated symptoms include leg swelling, rhinorrhea and wheezing. Pertinent negatives include no chest pain, sore throat, sputum production or swollen glands. The symptoms are aggravated by pollens.  His ankles have been swelling more lately. The wheezing gets worse on his side and if he leans far back in his chair.  He denies PND/orthopnea.    Review of Systems  Constitutional: Positive for fatigue. Negative for chills and diaphoresis.  HENT: Positive for congestion, postnasal drip, rhinorrhea and sneezing. Negative for sore throat.   Eyes: Negative for visual disturbance.  Respiratory: Positive for shortness of breath and wheezing. Negative for sputum production.   Cardiovascular: Positive for leg swelling. Negative for chest pain and palpitations.  Gastrointestinal: Negative for abdominal distention.  Psychiatric/Behavioral: Positive for sleep disturbance.    Patient Active Problem List   Diagnosis Date Noted  . Claudication of lower extremity ([place]) 09/06/2016  . BPH with obstruction/lower urinary tract symptoms 01/02/2016  . Recurrent UTI 01/02/2016  . Hiatal hernia 12/09/2015  . Irritable bowel syndrome (IBS) 12/09/2015  . Essential hypertension 12/09/2015  . Idiopathic insomnia 06/30/2015  . Local edema 06/30/2015  . Cardiomyopathy, ischemic 06/30/2015  . History of prolonged Q-T interval on ECG 06/30/2015  . Acid reflux 06/30/2015  . Allergic state 06/30/2015  . Dyslipidemia 06/30/2015  . Chronic diastolic heart failure  ([place]) 06/30/2015  . Acquired hypothyroidism 06/30/2015  . Other long term (current) drug therapy 06/05/2012  . MI (mitral incompetence) 01/24/2012  . Diabetes mellitus without complication ([place]) 27/51/7001  . Paroxysmal ventricular tachycardia ([place]) 08/23/2011  . Arteriosclerosis of coronary artery 08/23/2011  . Automatic implantable cardioverter-defibrillator in situ 08/16/2011    Prior to Admission medications   Medication Sig Start Date End Date Taking? Authorizing Provider  aspirin 81 MG tablet Take by mouth. 11/16/10  Yes [provider]  furosemide (LASIX) 20 MG tablet TAKE (1) TABLET BY MOUTH EVERY DAY 08/02/16  Yes Glean Hess, MD  glimepiride (AMARYL) 4 MG tablet Take 1 tablet (4 mg total) by mouth daily with breakfast. 12/17/16  Yes Glean Hess, MD  levothyroxine (SYNTHROID, LEVOTHROID) 25 MCG tablet TAKE 1 TABLET ON AN EMPTY STOMACH WITH A GLASS OF WATER AT LEAST 30 TO 60 MINUTES BEFORE BREAKFAST. 01/10/17  Yes Glean Hess, MD  lisinopril (PRINIVIL,ZESTRIL) 5 MG tablet Take by mouth daily. 08/29/15  Yes [provider]  magnesium oxide (MAG-OX) 400 MG tablet Take 400 mg by mouth daily. 01/22/17  Yes [provider]  metFORMIN (GLUCOPHAGE) 500 MG tablet TAKE ONE TABLET BY MOUTH TWICE DAILY. WITH A MEAL 01/21/17  Yes Glean Hess, MD  metoprolol (LOPRESSOR) 50 MG tablet Take 0.5 tablets by mouth 2 (two) times daily.  01/11/15  Yes [provider]  Multiple Vitamin (MULTI-VITAMINS) TABS Take by mouth. Reported on 01/16/2016   Yes [provider]  potassium chloride SA (K-DUR,KLOR-CON) 20 MEQ tablet TAKE (1) TABLET BY MOUTH EVERY DAY 09/24/16  Yes Glean Hess, MD  simvastatin (ZOCOR) 40 MG tablet TAKE (1) TABLET BY MOUTH DAILY AT BEDTIME 08/31/16  Yes Glean Hess, MD  sotalol (BETAPACE) 120 MG tablet Take by mouth. 07/04/16  Yes [provider]    Allergies  Allergen Reactions  . Penicillins Rash  .  Sulfa Antibiotics Rash    Full body rash, itching, face turns bright red    Past Surgical History:  Procedure Laterality Date  . CARDIAC DEFIBRILLATOR PLACEMENT  2014   PSVT  . CATARACT EXTRACTION, BILATERAL    . CORONARY ARTERY BYPASS GRAFT  2011   x3    Social History  Substance Use Topics  . Smoking status: Never Smoker  . Smokeless tobacco: Never Used  . Alcohol use No     Medication list has been reviewed and updated.   Physical Exam  Constitutional: He is oriented to person, place, and time. He appears well-developed. No distress.  HENT:  Head: Normocephalic and atraumatic.  Neck: JVD present. Carotid bruit is not present.  Cardiovascular: Normal rate, regular rhythm, normal heart sounds and intact distal pulses.  Exam reveals no S3 and no S4.   Pulmonary/Chest: Effort normal. No respiratory distress. He has decreased breath sounds. He has rales.  Musculoskeletal: He exhibits edema.  Neurological: He is alert and oriented to person, place, and time.  Skin: Skin is warm and dry. No rash noted.  Psychiatric: He has a normal mood and affect. His behavior is normal. Thought content normal.  Nursing note and vitals reviewed.   BP 118/66 (BP Location: Right Arm, Patient Position: Sitting, Cuff Size: Normal)   Pulse (!) 58   Temp 97.6 F (36.4 C)   Ht 6\' 2"  (1.88 m)   Wt 184 lb (83.5 kg)   SpO2 96%   BMI 23.62 kg/m   Assessment and Plan: 1. Chronic diastolic heart failure (HCC) Double lasix for next 3 days Call to report sxs May need to see cardiology sooner than 6/28 - B Nat Peptide - DG Chest 2 View  2. Essential hypertension controlled  3. Allergic state, subsequent encounter Xyzal samples given  No orders of the defined types were placed in this encounter.   Halina Maidens, MD [facility]  03/11/2017

## 2017-03-12 LAB — BRAIN NATRIURETIC PEPTIDE: BNP: 1612.3 pg/mL — ABNORMAL HIGH (ref 0.0–100.0)

## 2017-03-13 ENCOUNTER — Other Ambulatory Visit: Payer: Self-pay | Admitting: Internal Medicine

## 2017-03-13 NOTE — Progress Notes (Signed)
Spoke to pt wife- feeling much better. Breathing much better, feet swelling went down, and doing much better. Will continue 40 mg Lasix.

## 2017-03-25 ENCOUNTER — Other Ambulatory Visit: Payer: Self-pay | Admitting: Internal Medicine

## 2017-04-12 ENCOUNTER — Other Ambulatory Visit: Payer: Self-pay | Admitting: Internal Medicine

## 2017-04-12 ENCOUNTER — Encounter: Payer: Self-pay | Admitting: Internal Medicine

## 2017-04-12 ENCOUNTER — Ambulatory Visit (INDEPENDENT_AMBULATORY_CARE_PROVIDER_SITE_OTHER): Payer: Medicare Other | Admitting: Internal Medicine

## 2017-04-12 VITALS — BP 118/62 | HR 64 | Ht 74.0 in | Wt 184.0 lb

## 2017-04-12 DIAGNOSIS — E113553 Type 2 diabetes mellitus with stable proliferative diabetic retinopathy, bilateral: Secondary | ICD-10-CM

## 2017-04-12 DIAGNOSIS — E785 Hyperlipidemia, unspecified: Secondary | ICD-10-CM | POA: Diagnosis not present

## 2017-04-12 DIAGNOSIS — I4729 Other ventricular tachycardia: Secondary | ICD-10-CM

## 2017-04-12 DIAGNOSIS — I472 Ventricular tachycardia: Secondary | ICD-10-CM | POA: Diagnosis not present

## 2017-04-12 DIAGNOSIS — I5032 Chronic diastolic (congestive) heart failure: Secondary | ICD-10-CM | POA: Diagnosis not present

## 2017-04-12 DIAGNOSIS — E113299 Type 2 diabetes mellitus with mild nonproliferative diabetic retinopathy without macular edema, unspecified eye: Secondary | ICD-10-CM | POA: Insufficient documentation

## 2017-04-12 MED ORDER — LEVOCETIRIZINE DIHYDROCHLORIDE 5 MG PO TABS: 5.0000 mg | ORAL_TABLET | Freq: Every evening | ORAL | 5 refills | Status: DC

## 2017-04-12 NOTE — Progress Notes (Signed)
Date:  04/12/2017   Name:  Terrance Carrillo   DOB:  04-06-1934   MRN:  [id number]   Chief Complaint: Diabetes Diabetes  He presents for his follow-up diabetic visit. He has type 2 diabetes mellitus. His disease course has been improving. Pertinent negatives for hypoglycemia include no headaches. Pertinent negatives for diabetes include no blurred vision, no chest pain, no fatigue, no polyphagia, no polyuria and no weight loss. Symptoms are stable. Current diabetic treatments: glimepiride increased last visit. He is compliant with treatment most of the time. He monitors blood glucose at home 1-2 x per week. His breakfast blood glucose is taken between 7-8 am. His breakfast blood glucose range is generally 110-130 mg/dl. An ACE inhibitor/angiotensin II receptor blocker is being taken. Eye exam is current.  Congestive Heart Failure  Presents for follow-up visit. Associated symptoms include shortness of breath (stable). Pertinent negatives include no abdominal pain, chest pain, chest pressure, fatigue, palpitations, paroxysmal nocturnal dyspnea or unexpected weight change. The symptoms have been improving (now on Lasix 40 mg per day).    Lab Results  Component Value Date   HGBA1C 8.8 (H) 12/13/2016     Review of Systems  Constitutional: Negative for chills, fatigue, unexpected weight change and weight loss.  Eyes: Negative for blurred vision.  Respiratory: Positive for shortness of breath (stable). Negative for cough, chest tightness and wheezing.   Cardiovascular: Negative for chest pain, palpitations and leg swelling.  Gastrointestinal: Negative for abdominal pain.  Endocrine: Negative for polyphagia and polyuria.  Musculoskeletal: Negative for arthralgias and gait problem.  Skin: Positive for wound (recent skin cancer excision left ear). Negative for rash.  Neurological: Negative for numbness and headaches.  Psychiatric/Behavioral: Negative for sleep disturbance.    Patient Active  Problem List   Diagnosis Date Noted  . Claudication of lower extremity ([place]) 09/06/2016  . BPH with obstruction/lower urinary tract symptoms 01/02/2016  . Recurrent UTI 01/02/2016  . Hiatal hernia 12/09/2015  . Irritable bowel syndrome (IBS) 12/09/2015  . Essential hypertension 12/09/2015  . Idiopathic insomnia 06/30/2015  . Local edema 06/30/2015  . Cardiomyopathy, ischemic 06/30/2015  . History of prolonged Q-T interval on ECG 06/30/2015  . Acid reflux 06/30/2015  . Allergic state 06/30/2015  . Dyslipidemia 06/30/2015  . Chronic diastolic heart failure ([place]) 06/30/2015  . Acquired hypothyroidism 06/30/2015  . Other long term (current) drug therapy 06/05/2012  . MI (mitral incompetence) 01/24/2012  . Diabetes mellitus without complication ([place]) 29/79/8921  . Paroxysmal ventricular tachycardia ([place]) 08/23/2011  . Arteriosclerosis of coronary artery 08/23/2011  . Automatic implantable cardioverter-defibrillator in situ 08/16/2011    Prior to Admission medications   Medication Sig Start Date End Date Taking? Authorizing Provider  aspirin 81 MG tablet Take by mouth. 11/16/10   [provider]  furosemide (LASIX) 20 MG tablet Take 2 tablets (40 mg total) by mouth daily. 03/13/17   Glean Hess, MD  glimepiride (AMARYL) 4 MG tablet TAKE ONE TABLET EVERY MORNING WITH BREAKFAST. 03/25/17   Glean Hess, MD  levothyroxine (SYNTHROID, LEVOTHROID) 25 MCG tablet TAKE 1 TABLET ON AN EMPTY STOMACH WITH A GLASS OF WATER AT LEAST 30 TO 60 MINUTES BEFORE BREAKFAST. 01/10/17   Glean Hess, MD  lisinopril (PRINIVIL,ZESTRIL) 5 MG tablet Take by mouth daily. 08/29/15   [provider]  magnesium oxide (MAG-OX) 400 MG tablet Take 400 mg by mouth daily. 01/22/17   [provider]  metFORMIN (GLUCOPHAGE) 500 MG tablet TAKE ONE TABLET BY MOUTH  TWICE DAILY. WITH A MEAL 01/21/17   Glean Hess, MD  metoprolol (LOPRESSOR) 50 MG tablet Take 0.5 tablets by mouth 2 (two)  times daily.  01/11/15   [provider]  Multiple Vitamin (MULTI-VITAMINS) TABS Take by mouth. Reported on 01/16/2016    [provider]  potassium chloride SA (K-DUR,KLOR-CON) 20 MEQ tablet TAKE ONE (1) TABLET BY MOUTH ONCE DAILY 03/25/17   Glean Hess, MD  simvastatin (ZOCOR) 40 MG tablet TAKE (1) TABLET BY MOUTH DAILY AT BEDTIME 08/31/16   Glean Hess, MD  sotalol (BETAPACE) 120 MG tablet Take by mouth. 07/04/16   [provider]    Allergies  Allergen Reactions  . Penicillins Rash  . Sulfa Antibiotics Rash    Full body rash, itching, face turns bright red    Past Surgical History:  Procedure Laterality Date  . CARDIAC DEFIBRILLATOR PLACEMENT  2014   PSVT  . CATARACT EXTRACTION, BILATERAL    . CORONARY ARTERY BYPASS GRAFT  2011   x3    Social History  Substance Use Topics  . Smoking status: Never Smoker  . Smokeless tobacco: Never Used  . Alcohol use No    Medication list has been reviewed and updated.   Physical Exam  Constitutional: He is oriented to person, place, and time. He appears well-developed. No distress.  HENT:  Head: Normocephalic and atraumatic.  Cardiovascular: Normal rate and regular rhythm.  Exam reveals distant heart sounds. Exam reveals no gallop.   Pulses:      Dorsalis pedis pulses are 1+ on the right side, and 1+ on the left side.       Posterior tibial pulses are 1+ on the right side, and 1+ on the left side.  Pulmonary/Chest: Effort normal and breath sounds normal. No respiratory distress. He has no wheezes. He has no rhonchi.  Musculoskeletal: Normal range of motion.  Neurological: He is alert and oriented to person, place, and time.  Skin: Skin is warm, dry and intact. No rash noted.  Psychiatric: He has a normal mood and affect. His speech is normal and behavior is normal. Thought content normal.  Nursing note and vitals reviewed.   BP 118/62   Pulse 64   Ht 6\' 2"  (1.88 m)   Wt 184 lb (83.5 kg)    SpO2 99%   BMI 23.62 kg/m   Assessment and Plan: 1. Chronic diastolic heart failure (HCC) Improved; continue lasix 40 mg - to see cardiology in several weeks - Basic metabolic panel - B Nat Peptide  2. Dyslipidemia On statin therapy - Lipid panel  3. Paroxysmal ventricular tachycardia (HCC) stable  4. Type 2 diabetes mellitus with stable proliferative retinopathy of both eyes, without long-term current use of insulin (HCC) - Hemoglobin A1c - Microalbumin / creatinine urine ratio   Meds ordered this encounter  Medications  . levocetirizine (XYZAL) 5 MG tablet    Sig: Take 1 tablet (5 mg total) by mouth every evening.    Dispense:  30 tablet    Refill:  [place], MD [facility]  04/12/2017

## 2017-04-13 LAB — LIPID PANEL
Chol/HDL Ratio: 4.3 ratio (ref 0.0–5.0)
Cholesterol, Total: 119 mg/dL (ref 100–199)
HDL: 28 mg/dL — AB (ref >39)
LDL Calculated: 71 mg/dL (ref 0–99)
TRIGLYCERIDES: 101 mg/dL (ref 0–149)
VLDL CHOLESTEROL CAL: 20 mg/dL (ref 5–40)

## 2017-04-13 LAB — HEMOGLOBIN A1C
Est. average glucose Bld gHb Est-mCnc: 194 mg/dL
Hgb A1c MFr Bld: 8.4 % — ABNORMAL HIGH (ref 4.8–5.6)

## 2017-04-13 LAB — BASIC METABOLIC PANEL
BUN/Creatinine Ratio: 17 (ref 10–24)
BUN: 26 mg/dL (ref 8–27)
CO2: 27 mmol/L (ref 18–29)
CREATININE: 1.55 mg/dL — AB (ref 0.76–1.27)
Calcium: 9.2 mg/dL (ref 8.6–10.2)
Chloride: 100 mmol/L (ref 96–106)
GFR calc non Af Amer: 42 mL/min/{1.73_m2} — ABNORMAL LOW (ref >59)
GFR, EST AFRICAN AMERICAN: 48 mL/min/{1.73_m2} — AB (ref >59)
GLUCOSE: 178 mg/dL — AB (ref 65–99)
POTASSIUM: 4.4 mmol/L (ref 3.5–5.2)
SODIUM: 142 mmol/L (ref 134–144)

## 2017-04-13 LAB — BRAIN NATRIURETIC PEPTIDE: BNP: 1074.1 pg/mL — AB (ref 0.0–100.0)

## 2017-04-15 LAB — MICROALBUMIN / CREATININE URINE RATIO
CREATININE, UR: 114.7 mg/dL
Microalb/Creat Ratio: 89.5 mg/g creat — ABNORMAL HIGH (ref 0.0–30.0)
Microalbumin, Urine: 102.6 ug/mL

## 2017-04-22 ENCOUNTER — Ambulatory Visit: Payer: Medicare Other

## 2017-04-25 ENCOUNTER — Telehealth: Payer: Self-pay

## 2017-04-25 NOTE — Telephone Encounter (Signed)
Called to r/s missed AWV. Rescheduled for July 9th. Pt confirmed

## 2017-05-03 DIAGNOSIS — D72829 Elevated white blood cell count, unspecified: Secondary | ICD-10-CM

## 2017-05-03 NOTE — Progress Notes (Signed)
Spoke with patient about abnormal labs done yesterday at [facility].  His WBC was 57,000.  I explained that I needed to refer him to a Hematologist and he agreed to be seen.  Will refer to [facility] in [place].  Results of his labs can be seen in [place].

## 2017-05-13 ENCOUNTER — Ambulatory Visit: Payer: Self-pay

## 2017-05-14 ENCOUNTER — Ambulatory Visit: Payer: Medicare Other | Admitting: Internal Medicine

## 2017-05-17 ENCOUNTER — Telehealth: Payer: Self-pay

## 2017-05-17 NOTE — Telephone Encounter (Signed)
Called to r/s annual wellness visit. Phone number disconnected

## 2017-05-20 ENCOUNTER — Ambulatory Visit: Payer: Medicare Other | Admitting: Oncology

## 2017-05-20 DIAGNOSIS — D649 Anemia, unspecified: Secondary | ICD-10-CM | POA: Insufficient documentation

## 2017-05-20 DIAGNOSIS — C9110 Chronic lymphocytic leukemia of B-cell type not having achieved remission: Secondary | ICD-10-CM | POA: Insufficient documentation

## 2017-05-24 ENCOUNTER — Telehealth: Payer: Self-pay

## 2017-05-24 ENCOUNTER — Other Ambulatory Visit: Payer: Self-pay | Admitting: Internal Medicine

## 2017-05-24 ENCOUNTER — Encounter: Payer: Self-pay | Admitting: Internal Medicine

## 2017-05-24 NOTE — Telephone Encounter (Signed)
Called to reschedule AWV. Unable to reach patient on phone number provided. Letter sent.

## 2017-05-30 ENCOUNTER — Ambulatory Visit (INDEPENDENT_AMBULATORY_CARE_PROVIDER_SITE_OTHER): Payer: Medicare Other

## 2017-05-30 DIAGNOSIS — E538 Deficiency of other specified B group vitamins: Secondary | ICD-10-CM | POA: Diagnosis not present

## 2017-05-30 MED ORDER — CYANOCOBALAMIN 1000 MCG/ML IJ SOLN
1000.0000 ug | Freq: Once | INTRAMUSCULAR | Status: AC
  Administered 2017-05-30: 1000 ug via INTRAMUSCULAR

## 2017-06-06 ENCOUNTER — Ambulatory Visit (INDEPENDENT_AMBULATORY_CARE_PROVIDER_SITE_OTHER): Payer: Medicare Other

## 2017-06-06 DIAGNOSIS — E538 Deficiency of other specified B group vitamins: Secondary | ICD-10-CM

## 2017-06-06 MED ORDER — CYANOCOBALAMIN 1000 MCG/ML IJ SOLN
1000.0000 ug | Freq: Once | INTRAMUSCULAR | Status: AC
  Administered 2017-06-06: 1000 ug via INTRAMUSCULAR

## 2017-06-08 IMAGING — US US RETROPERITONEAL COMPLETE
1 series · 14 of 25 positions shown · non-contrast
Comparison: 01/16/2016 radiograph, 01/12/2016 renal ultrasound

CLINICAL DATA: Pulsatile mass in the abdomen, family history of
aneurysm

EXAM:
ULTRASOUND RETROPERITONEAL COMPLETE
TECHNIQUE: Ultrasound examination of the abdominal aorta was performed to
evaluate for abdominal aortic aneurysm. The common iliac arteries,
IVC, and kidneys were also evaluated.

[Series 1: us retroperitoneal complete · 0.25mm/px · 14 of 73 slices shown]
[im 1/73]
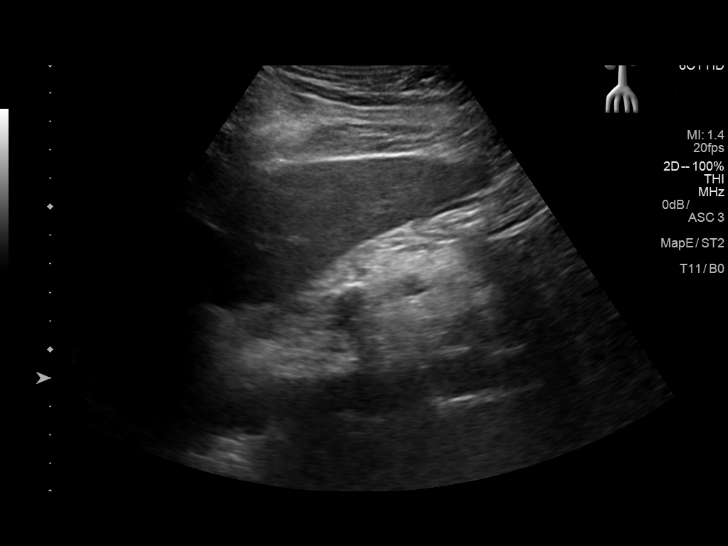
[im 7/73]
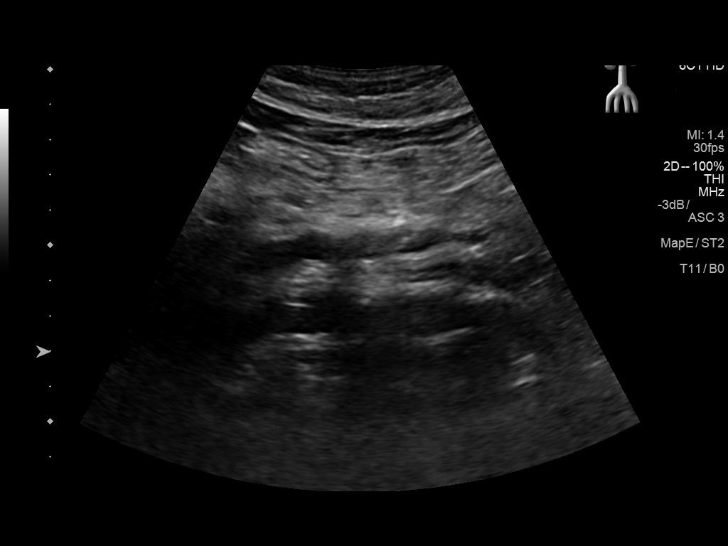
[im 13/73]
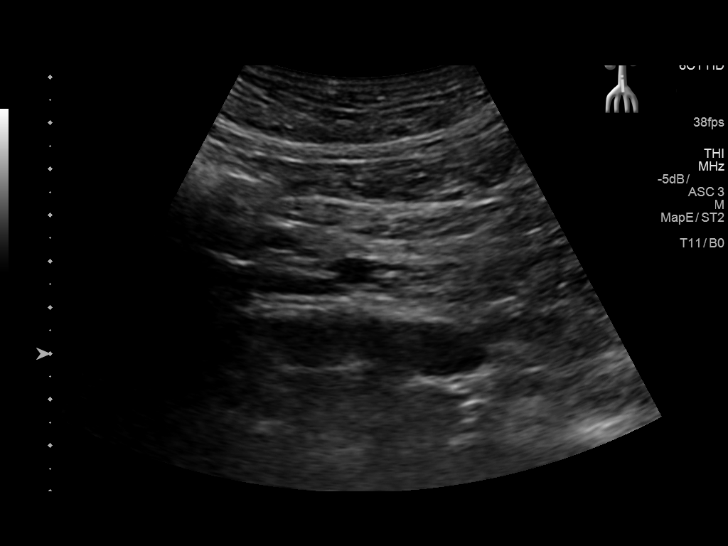
[im 19/73]
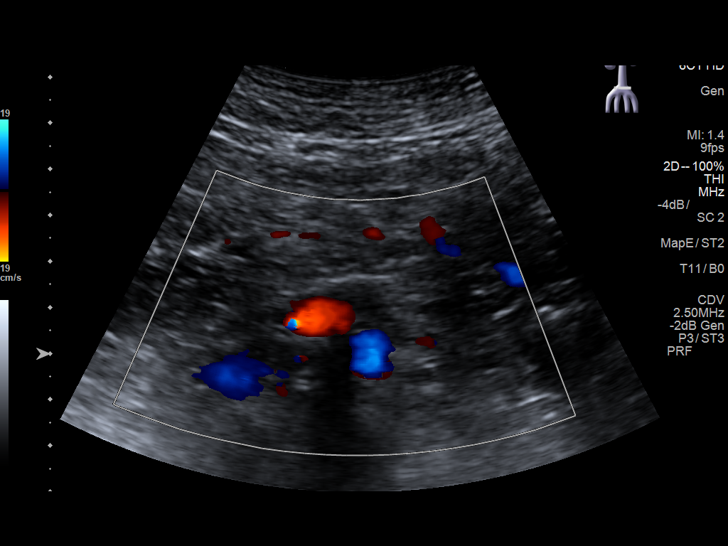
[im 25/73]
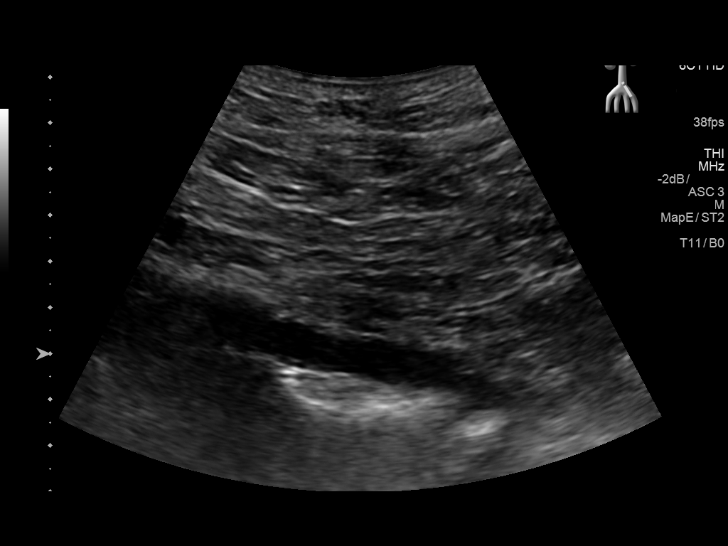
[im 28/73]
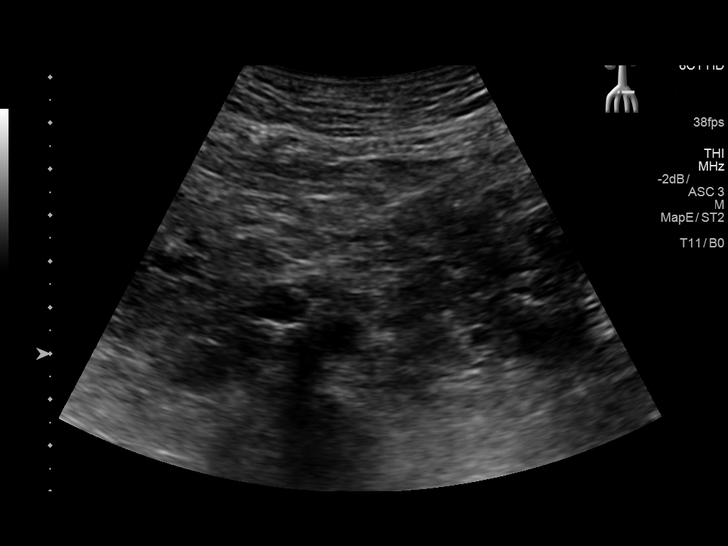
[im 34/73]
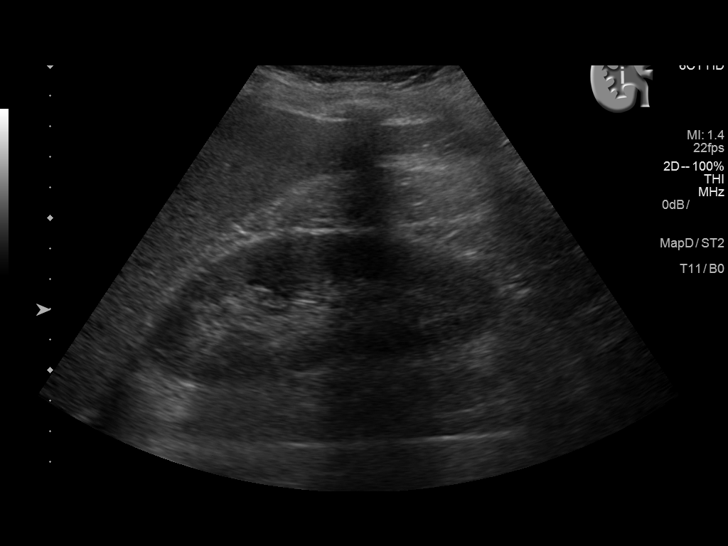
[im 40/73]
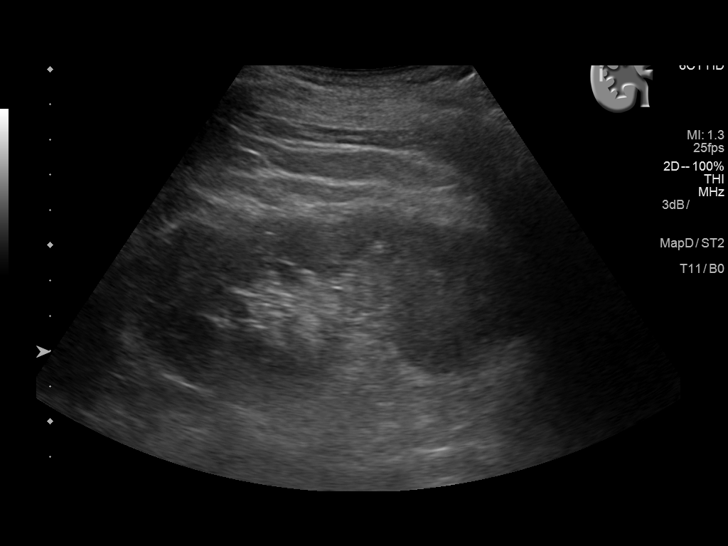
[im 46/73]
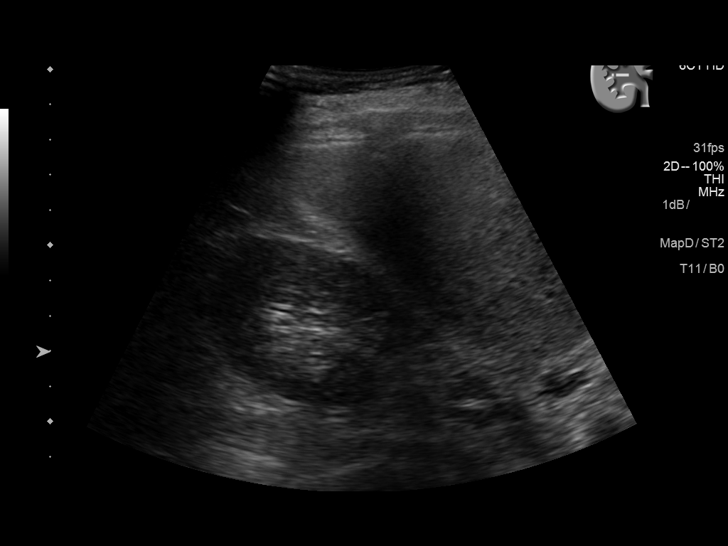
[im 49/73]
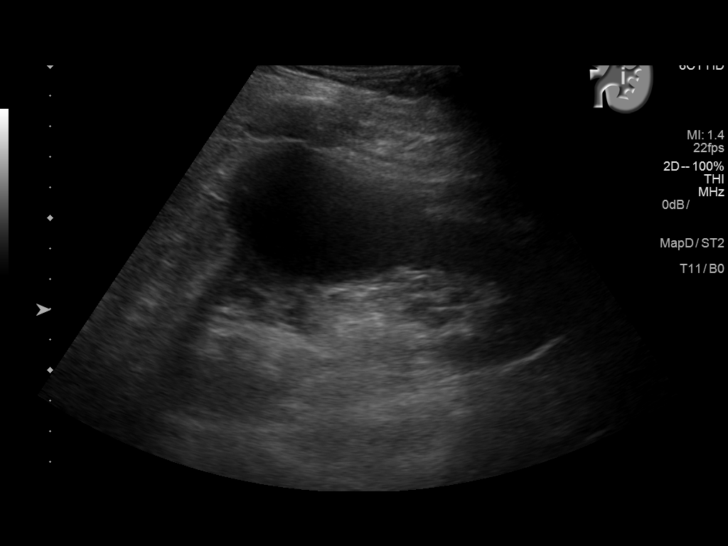
[im 55/73]
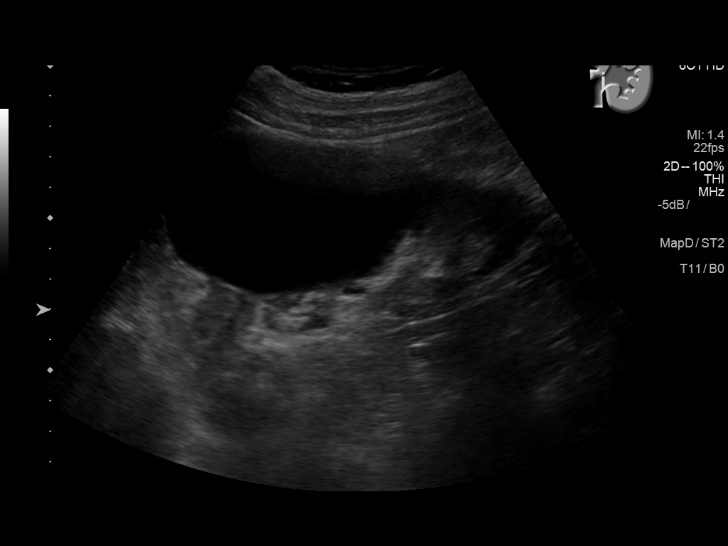
[im 61/73]
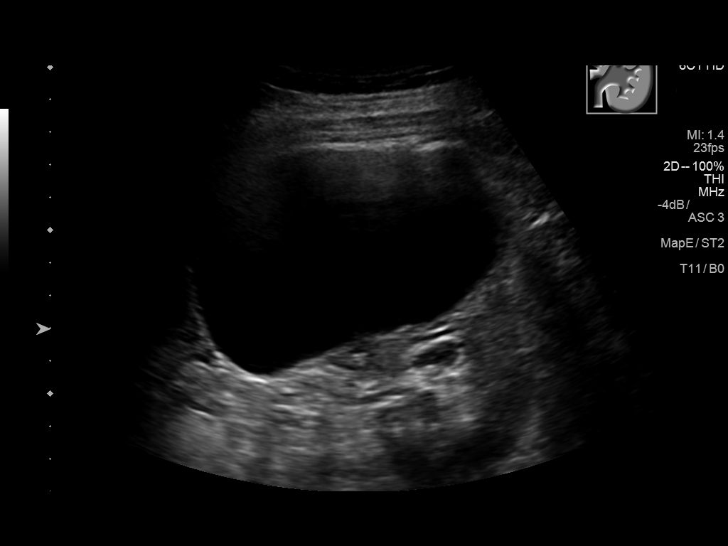
[im 67/73]
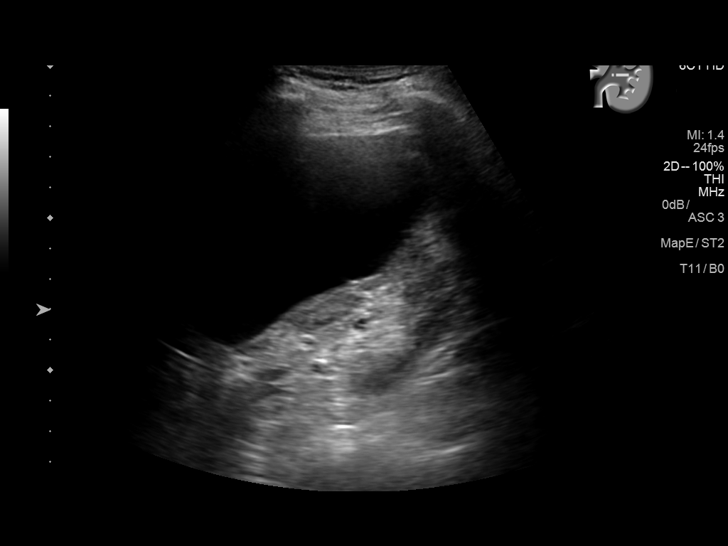
[im 73/73]
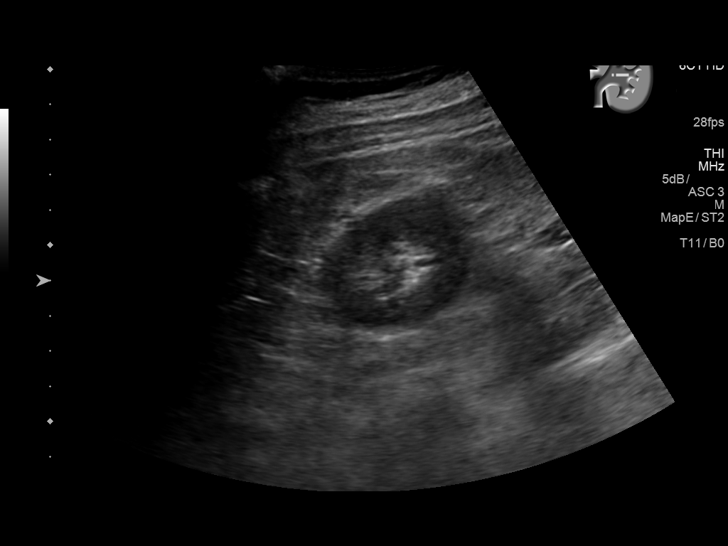

[14 of 25 positions shown; findings below may reference images not displayed]

FINDINGS: Abdominal Aorta

No aneurysm identified.  Calcifications are present.

Maximum AP

Diameter:  2.4 cm

Maximum TRV

Diameter: 2.3 cm

Right Common Iliac Artery

No aneurysm identified.

Left Common Iliac Artery

No aneurysm identified.

IVC

No abnormality visualized.

Right Kidney

Length: 11.9 cm Echogenicity within normal limits. No mass or
hydronephrosis visualized.

Left Kidney

Length: 14.5 cm Echogenicity within normal limits. Large cyst
medially, this measures 10.2 x 8.2 x 9.7 cm.
IMPRESSION: 1. No sonographic evidence for abdominal aortic aneurysm. There is
atherosclerotic disease.
2. Large left renal cyst.  Kidneys are otherwise unremarkable.

## 2017-06-13 ENCOUNTER — Ambulatory Visit (INDEPENDENT_AMBULATORY_CARE_PROVIDER_SITE_OTHER): Payer: Medicare Other

## 2017-06-13 DIAGNOSIS — E538 Deficiency of other specified B group vitamins: Secondary | ICD-10-CM | POA: Diagnosis not present

## 2017-06-13 MED ORDER — CYANOCOBALAMIN 1000 MCG/ML IJ SOLN
1000.0000 ug | Freq: Once | INTRAMUSCULAR | Status: AC
  Administered 2017-06-13: 1000 ug via INTRAMUSCULAR

## 2017-06-18 ENCOUNTER — Ambulatory Visit (INDEPENDENT_AMBULATORY_CARE_PROVIDER_SITE_OTHER): Payer: Medicare Other | Admitting: Internal Medicine

## 2017-06-18 ENCOUNTER — Encounter: Payer: Self-pay | Admitting: Internal Medicine

## 2017-06-18 VITALS — BP 128/60 | HR 62 | Temp 97.5°F | Ht 74.0 in | Wt 184.0 lb

## 2017-06-18 DIAGNOSIS — I5032 Chronic diastolic (congestive) heart failure: Secondary | ICD-10-CM

## 2017-06-18 DIAGNOSIS — J0100 Acute maxillary sinusitis, unspecified: Secondary | ICD-10-CM | POA: Diagnosis not present

## 2017-06-18 DIAGNOSIS — K5904 Chronic idiopathic constipation: Secondary | ICD-10-CM | POA: Diagnosis not present

## 2017-06-18 MED ORDER — DOXYCYCLINE HYCLATE 100 MG PO TABS: 100.0000 mg | ORAL_TABLET | Freq: Two times a day (BID) | ORAL | 0 refills | Status: DC

## 2017-06-18 NOTE — Patient Instructions (Addendum)
Get Stool softener with vegetable laxative (not stimulant laxative) and take One a day in the morning with your beverage of choice.  Take an extra Lasix daily for the next 2 days to help with wheezing.

## 2017-06-18 NOTE — Progress Notes (Signed)
Date:  06/18/2017   Name:  Terrance Carrillo   DOB:  [birth year]   MRN:  [id number]   Chief Complaint: Sinus Drainage (throat is itchy and feeling like drainage it causing it. Headache for the past few days. Right ear feels like stopping up. No fever or cough.  )  Sinus Problem  This is a new problem. The problem has been gradually worsening since onset. There has been no fever. Associated symptoms include congestion, headaches, shortness of breath and sinus pressure. Pertinent negatives include no chills or ear pain.  Constipation  This is a recurrent problem. The problem is unchanged. His stool frequency is 1 time per week or less. The stool is described as firm. Pertinent negatives include no fever or rectal pain. He has tried nothing for the symptoms.  Wheezing - he notices wheezing and mild SOB when reclined.  No cough, no increase in weight or edema.  CHF is stable per last cardiology evaluation.  In the past his lasix dose was too high.   Review of Systems  Constitutional: Negative for chills, fatigue and fever.  HENT: Positive for congestion, postnasal drip and sinus pressure. Negative for ear discharge and ear pain.   Eyes: Negative for discharge and visual disturbance.  Respiratory: Positive for shortness of breath and wheezing (when lying down). Negative for choking.   Cardiovascular: Negative for chest pain, palpitations and leg swelling.  Gastrointestinal: Positive for constipation. Negative for blood in stool and rectal pain.  Genitourinary: Negative for dysuria and scrotal swelling.  Neurological: Positive for headaches. Negative for dizziness.  Psychiatric/Behavioral: Negative for sleep disturbance.    Patient Active Problem List   Diagnosis Date Noted  . Nonproliferative diabetic retinopathy ([place]) 04/12/2017  . Claudication of lower extremity ([place]) 09/06/2016  . BPH with obstruction/lower urinary tract symptoms 01/02/2016  . Recurrent UTI 01/02/2016  . Hiatal hernia  12/09/2015  . Irritable bowel syndrome (IBS) 12/09/2015  . Essential hypertension 12/09/2015  . Idiopathic insomnia 06/30/2015  . Local edema 06/30/2015  . Cardiomyopathy, ischemic 06/30/2015  . History of prolonged Q-T interval on ECG 06/30/2015  . Acid reflux 06/30/2015  . Environmental and seasonal allergies 06/30/2015  . Dyslipidemia 06/30/2015  . Chronic diastolic heart failure ([place]) 06/30/2015  . Acquired hypothyroidism 06/30/2015  . Other long term (current) drug therapy 06/05/2012  . MI (mitral incompetence) 01/24/2012  . Type 2 diabetes mellitus with retinopathy ([place]) 01/24/2012  . Paroxysmal ventricular tachycardia ([place]) 08/23/2011  . Arteriosclerosis of coronary artery 08/23/2011  . Automatic implantable cardioverter-defibrillator in situ 08/16/2011    Prior to Admission medications   Medication Sig Start Date End Date Taking? Authorizing Provider  aspirin 81 MG tablet Take by mouth. 11/16/10  Yes [provider]  furosemide (LASIX) 20 MG tablet Take 2 tablets (40 mg total) by mouth daily. 03/13/17  Yes Glean Hess, MD  glimepiride (AMARYL) 4 MG tablet TAKE ONE TABLET EVERY MORNING WITH BREAKFAST. 03/25/17  Yes Glean Hess, MD  levocetirizine (XYZAL) 5 MG tablet Take 1 tablet (5 mg total) by mouth every evening. 04/12/17  Yes Glean Hess, MD  levothyroxine (SYNTHROID, LEVOTHROID) 25 MCG tablet TAKE 1 TABLET ON AN EMPTY STOMACH WITH A GLASS OF WATER AT LEAST 30 TO 60 MINUTES BEFORE BREAKFAST. 01/10/17  Yes Glean Hess, MD  lisinopril (PRINIVIL,ZESTRIL) 5 MG tablet Take by mouth daily. 08/29/15  Yes [provider]  magnesium oxide (MAG-OX) 400 MG tablet Take 400 mg by mouth daily. 01/22/17  Yes [provider]  metFORMIN (GLUCOPHAGE) 500 MG tablet TAKE ONE TABLET BY MOUTH TWICE DAILY. WITH A MEAL 01/21/17  Yes Glean Hess, MD  metoprolol (LOPRESSOR) 50 MG tablet Take 0.5 tablets by mouth 2 (two) times daily.  01/11/15  Yes [provider]  Multiple Vitamin (MULTI-VITAMINS) TABS Take by mouth. Reported on 01/16/2016   Yes [provider]  potassium chloride SA (K-DUR,KLOR-CON) 20 MEQ tablet TAKE ONE (1) TABLET BY MOUTH ONCE DAILY 03/25/17  Yes Glean Hess, MD  simvastatin (ZOCOR) 40 MG tablet TAKE (1) TABLET BY MOUTH DAILY AT BEDTIME 05/24/17  Yes Glean Hess, MD  sotalol (BETAPACE) 120 MG tablet Take by mouth. 07/04/16  Yes [provider]    Allergies  Allergen Reactions  . Penicillins Rash  . Sulfa Antibiotics Rash    Full body rash, itching, face turns bright red    Past Surgical History:  Procedure Laterality Date  . CARDIAC DEFIBRILLATOR PLACEMENT  2014   PSVT  . CATARACT EXTRACTION, BILATERAL    . CORONARY ARTERY BYPASS GRAFT  2011   x3    Social History  Substance Use Topics  . Smoking status: Never Smoker  . Smokeless tobacco: Never Used  . Alcohol use No     Medication list has been reviewed and updated.   Physical Exam  Constitutional: He is oriented to person, place, and time. He appears well-developed. No distress.  HENT:  Head: Normocephalic and atraumatic.  Right Ear: Tympanic membrane and ear canal normal.  Left Ear: Tympanic membrane and ear canal normal.  Nose: Right sinus exhibits no maxillary sinus tenderness and no frontal sinus tenderness. Left sinus exhibits maxillary sinus tenderness.  Mouth/Throat: Posterior oropharyngeal erythema present. No oropharyngeal exudate or posterior oropharyngeal edema.  Neck: Normal range of motion. JVD (when supine R>L) present.  Cardiovascular: Normal rate, regular rhythm and normal heart sounds.  Exam reveals decreased pulses. Exam reveals no gallop.   No murmur heard. Pulmonary/Chest: Effort normal and breath sounds normal. No respiratory distress. He has no wheezes.  Coarse breath sounds bilaterally  Abdominal: Soft. Normal appearance and bowel sounds are normal. He exhibits no ascites and no mass. There is  no tenderness.  Musculoskeletal: Normal range of motion.  Neurological: He is alert and oriented to person, place, and time.  Skin: Skin is warm and dry. No rash noted.  Psychiatric: He has a normal mood and affect. His behavior is normal. Thought content normal.  Nursing note and vitals reviewed.   BP 128/60   Pulse 62   Ht 6\' 2"  (1.88 m)   Wt 184 lb (83.5 kg)   SpO2 95%   BMI 23.62 kg/m   Assessment and Plan: 1. Chronic idiopathic constipation Begin stool softener with vegetable laxative daily with 6 oz fluid  2. Acute maxillary sinusitis, recurrence not specified Resume flonase - doxycycline (VIBRA-TABS) 100 MG tablet; Take 1 tablet (100 mg total) by mouth 2 (two) times daily.  Dispense: 20 tablet; Refill: 0  3. Chronic diastolic heart failure (HCC) Mild JVD and crackles on exam but no increase in weight or LE edema Will continue current therapy but take extra lasix for 2 days- to follow up if sx are worsening   Meds ordered this encounter  Medications  . doxycycline (VIBRA-TABS) 100 MG tablet    Sig: Take 1 tablet (100 mg total) by mouth 2 (two) times daily.    Dispense:  20 tablet    Refill:  0  Halina Maidens, MD [facility]  06/18/2017

## 2017-06-21 ENCOUNTER — Ambulatory Visit (INDEPENDENT_AMBULATORY_CARE_PROVIDER_SITE_OTHER): Payer: Medicare Other

## 2017-06-21 ENCOUNTER — Telehealth: Payer: Self-pay

## 2017-06-21 DIAGNOSIS — E538 Deficiency of other specified B group vitamins: Secondary | ICD-10-CM

## 2017-06-21 MED ORDER — CYANOCOBALAMIN 1000 MCG/ML IJ SOLN
1000.0000 ug | Freq: Once | INTRAMUSCULAR | Status: AC
  Administered 2017-06-21: 1000 ug via INTRAMUSCULAR

## 2017-06-21 NOTE — Telephone Encounter (Signed)
Gave B12 injection. Pt tolerated well.

## 2017-07-15 ENCOUNTER — Other Ambulatory Visit: Payer: Self-pay | Admitting: Internal Medicine

## 2017-07-23 ENCOUNTER — Ambulatory Visit (INDEPENDENT_AMBULATORY_CARE_PROVIDER_SITE_OTHER): Payer: Medicare Other

## 2017-07-23 DIAGNOSIS — E538 Deficiency of other specified B group vitamins: Secondary | ICD-10-CM

## 2017-07-23 MED ORDER — CYANOCOBALAMIN 1000 MCG/ML IJ SOLN
1000.0000 ug | Freq: Once | INTRAMUSCULAR | Status: AC
  Administered 2017-07-23: 1000 ug via INTRAMUSCULAR

## 2017-08-06 ENCOUNTER — Other Ambulatory Visit: Payer: Self-pay | Admitting: Internal Medicine

## 2017-08-12 ENCOUNTER — Encounter: Payer: Self-pay | Admitting: Internal Medicine

## 2017-08-12 ENCOUNTER — Ambulatory Visit (INDEPENDENT_AMBULATORY_CARE_PROVIDER_SITE_OTHER): Payer: Medicare Other | Admitting: Internal Medicine

## 2017-08-12 ENCOUNTER — Telehealth: Payer: Self-pay

## 2017-08-12 VITALS — BP 118/58 | HR 62 | Ht 74.0 in | Wt 184.0 lb

## 2017-08-12 DIAGNOSIS — Z23 Encounter for immunization: Secondary | ICD-10-CM | POA: Diagnosis not present

## 2017-08-12 DIAGNOSIS — E039 Hypothyroidism, unspecified: Secondary | ICD-10-CM

## 2017-08-12 DIAGNOSIS — I472 Ventricular tachycardia: Secondary | ICD-10-CM | POA: Diagnosis not present

## 2017-08-12 DIAGNOSIS — E113553 Type 2 diabetes mellitus with stable proliferative diabetic retinopathy, bilateral: Secondary | ICD-10-CM

## 2017-08-12 DIAGNOSIS — K5904 Chronic idiopathic constipation: Secondary | ICD-10-CM | POA: Diagnosis not present

## 2017-08-12 DIAGNOSIS — I10 Essential (primary) hypertension: Secondary | ICD-10-CM

## 2017-08-12 DIAGNOSIS — I4729 Other ventricular tachycardia: Secondary | ICD-10-CM

## 2017-08-12 NOTE — Progress Notes (Signed)
Date:  08/12/2017   Name:  Terrance Carrillo   DOB:  [birth year]   MRN:  [id number]   Chief Complaint: Diabetes; Hypothyroidism; Immunizations (High Dose flu vaccine); and Atrial Fibrillation  Diabetes  He presents for his follow-up diabetic visit. He has type 2 diabetes mellitus. His disease course has been stable. There are no hypoglycemic associated symptoms. Pertinent negatives for hypoglycemia include no dizziness or headaches. Pertinent negatives for diabetes include no chest pain and no fatigue. His breakfast blood glucose is taken between 6-7 am. His breakfast blood glucose range is generally 110-130 mg/dl. An ACE inhibitor/angiotensin II receptor blocker is being taken. Eye exam is current.  Thyroid Problem  Presents for follow-up visit. Symptoms include constipation. Patient reports no fatigue or palpitations. The symptoms have been stable.  Hypertension  This is a chronic problem. The problem is controlled. Pertinent negatives include no chest pain, headaches, palpitations or shortness of breath. Identifiable causes of hypertension include a thyroid problem.  Constipation  This is a chronic problem. The problem is unchanged. His stool frequency is 1 time per week or less. The stool is described as firm and pellet like. The patient is not on a high fiber diet. He does not exercise regularly. There has been adequate water intake. Pertinent negatives include no abdominal pain or fever. He has tried nothing for the symptoms.  Paroxismal VT - sotalol dose decreased, may have a bit more energy since also on B12.  Needs EKG today.  Review of Systems  Constitutional: Negative for chills, fatigue, fever and unexpected weight change.  Respiratory: Positive for wheezing (sometimes at night when supine). Negative for cough, chest tightness and shortness of breath.   Cardiovascular: Positive for leg swelling. Negative for chest pain and palpitations.  Gastrointestinal: Positive for constipation.  Negative for abdominal pain and blood in stool.  Neurological: Negative for dizziness and headaches.  Psychiatric/Behavioral: Negative for sleep disturbance.    Patient Active Problem List   Diagnosis Date Noted  . Chronic idiopathic constipation 06/18/2017  . Nonproliferative diabetic retinopathy ([place]) 04/12/2017  . Claudication of lower extremity ([place]) 09/06/2016  . BPH with obstruction/lower urinary tract symptoms 01/02/2016  . Recurrent UTI 01/02/2016  . Hiatal hernia 12/09/2015  . Irritable bowel syndrome (IBS) 12/09/2015  . Essential hypertension 12/09/2015  . Idiopathic insomnia 06/30/2015  . Local edema 06/30/2015  . Cardiomyopathy, ischemic 06/30/2015  . History of prolonged Q-T interval on ECG 06/30/2015  . Acid reflux 06/30/2015  . Environmental and seasonal allergies 06/30/2015  . Dyslipidemia 06/30/2015  . Chronic diastolic heart failure ([place]) 06/30/2015  . Acquired hypothyroidism 06/30/2015  . Other long term (current) drug therapy 06/05/2012  . MI (mitral incompetence) 01/24/2012  . Type 2 diabetes mellitus with retinopathy ([place]) 01/24/2012  . Paroxysmal ventricular tachycardia ([place]) 08/23/2011  . Arteriosclerosis of coronary artery 08/23/2011  . Automatic implantable cardioverter-defibrillator in situ 08/16/2011    Prior to Admission medications   Medication Sig Start Date End Date Taking? Authorizing Provider  aspirin 81 MG tablet Take by mouth. 11/16/10  Yes [provider]  furosemide (LASIX) 20 MG tablet Take 2 tablets (40 mg total) by mouth daily. 03/13/17  Yes Glean Hess, MD  glimepiride (AMARYL) 4 MG tablet TAKE ONE TABLET EVERY MORNING WITH BREAKFAST 07/15/17  Yes Glean Hess, MD  levocetirizine (XYZAL) 5 MG tablet Take 1 tablet (5 mg total) by mouth every evening. 04/12/17  Yes Glean Hess, MD  levothyroxine (SYNTHROID, LEVOTHROID) 25 MCG tablet TAKE  1 TABLET ON AN EMPTY STOMACH WITH A GLASS OF WATER AT LEAST 30 TO 60 MINUTES BEFORE  BREAKFAST. 08/06/17  Yes Glean Hess, MD  lisinopril (PRINIVIL,ZESTRIL) 5 MG tablet Take by mouth daily. 08/29/15  Yes [provider]  magnesium oxide (MAG-OX) 400 MG tablet Take 400 mg by mouth daily. 01/22/17  Yes [provider]  metFORMIN (GLUCOPHAGE) 500 MG tablet TAKE ONE TABLET BY MOUTH TWICE DAILY. WITH A MEAL 01/21/17  Yes Glean Hess, MD  Multiple Vitamin (MULTI-VITAMINS) TABS Take by mouth. Reported on 01/16/2016   Yes [provider]  potassium chloride SA (K-DUR,KLOR-CON) 20 MEQ tablet TAKE ONE (1) TABLET BY MOUTH ONCE DAILY 03/25/17  Yes Glean Hess, MD  simvastatin (ZOCOR) 40 MG tablet TAKE (1) TABLET BY MOUTH DAILY AT BEDTIME 05/24/17  Yes Glean Hess, MD  SOTALOL AF 80 MG TABS Take 60 mg by mouth 2 (two) times daily.   Yes [provider]    Allergies  Allergen Reactions  . Penicillins Rash  . Sulfa Antibiotics Rash    Full body rash, itching, face turns bright red    Past Surgical History:  Procedure Laterality Date  . CARDIAC DEFIBRILLATOR PLACEMENT  2014   PSVT  . CATARACT EXTRACTION, BILATERAL    . CORONARY ARTERY BYPASS GRAFT  2011   x3    Social History  Substance Use Topics  . Smoking status: Never Smoker  . Smokeless tobacco: Never Used  . Alcohol use No     Medication list has been reviewed and updated.  PHQ 2/9 Scores 08/12/2017 08/08/2016 07/08/2015  PHQ - 2 Score 0 0 0   Fall Risk  08/12/2017 08/08/2016 07/08/2015  Falls in the past year? No No No    Physical Exam  Constitutional: He is oriented to person, place, and time. He appears well-developed. No distress.  HENT:  Head: Normocephalic and atraumatic.  Cardiovascular: Normal rate, regular rhythm and intact distal pulses.   Occasional extrasystoles are present. Exam reveals no gallop.   Murmur heard.  Systolic murmur is present with a grade of 2/6  Pulmonary/Chest: Effort normal. No respiratory distress.  Abdominal: Soft. Normal  appearance and bowel sounds are normal. There is tenderness in the left lower quadrant. There is no rigidity and no guarding.  Musculoskeletal: Normal range of motion.  Neurological: He is alert and oriented to person, place, and time.  Skin: Skin is warm and dry. No rash noted.  Psychiatric: He has a normal mood and affect. His speech is normal and behavior is normal. Thought content normal.  Nursing note and vitals reviewed.   BP (!) 118/58 (BP Location: Right Arm, Patient Position: Sitting, Cuff Size: Large)   Pulse 62   Ht 6\' 2"  (1.88 m)   Wt 184 lb (83.5 kg)   SpO2 94%   BMI 23.62 kg/m   Assessment and Plan: 1. Type 2 diabetes mellitus with stable proliferative retinopathy of both eyes, without long-term current use of insulin (HCC) Continue current therapy - Hemoglobin T1X - Basic metabolic panel  2. Acquired hypothyroidism supplemented - TSH  3. Essential hypertension controlled  4. Paroxysmal ventricular tachycardia ([place]) Will fax EKG to Cardiology - EKG 12-Lead  5. Need for influenza vaccination - Flu vaccine HIGH DOSE PF  6. Chronic idiopathic constipation Recommend stool softeners 2 tabs with 8 oz fluid daily   No orders of the defined types were placed in this encounter.   Partially dictated using Editor, commissioning. Any errors  are unintentional.  Halina Maidens, MD [facility]  08/12/2017

## 2017-08-12 NOTE — Telephone Encounter (Signed)
Please schedule 4 month follow up for diabetes with Dr Army Melia and schedule Maw visit with Amy.

## 2017-08-12 NOTE — Patient Instructions (Signed)
Take 2 stool softeners daily with 8 oz of fluids for constipation

## 2017-08-13 ENCOUNTER — Telehealth: Payer: Self-pay

## 2017-08-13 LAB — BASIC METABOLIC PANEL
BUN / CREAT RATIO: 10 (ref 10–24)
BUN: 15 mg/dL (ref 8–27)
CALCIUM: 9.3 mg/dL (ref 8.6–10.2)
CHLORIDE: 103 mmol/L (ref 96–106)
CO2: 24 mmol/L (ref 20–29)
Creatinine, Ser: 1.46 mg/dL — ABNORMAL HIGH (ref 0.76–1.27)
GFR, EST AFRICAN AMERICAN: 52 mL/min/{1.73_m2} — AB (ref >59)
GFR, EST NON AFRICAN AMERICAN: 45 mL/min/{1.73_m2} — AB (ref >59)
Glucose: 151 mg/dL — ABNORMAL HIGH (ref 65–99)
POTASSIUM: 4.6 mmol/L (ref 3.5–5.2)
SODIUM: 144 mmol/L (ref 134–144)

## 2017-08-13 LAB — HEMOGLOBIN A1C
ESTIMATED AVERAGE GLUCOSE: 174 mg/dL
Hgb A1c MFr Bld: 7.7 % — ABNORMAL HIGH (ref 4.8–5.6)

## 2017-08-13 LAB — TSH: TSH: 2.87 u[IU]/mL (ref 0.450–4.500)

## 2017-08-13 NOTE — Telephone Encounter (Signed)
Spoke to patient wife about Dm good in recent labs. Better than before. Told to continue same medications.

## 2017-08-13 NOTE — Telephone Encounter (Signed)
Pt scheduled for apts in feb.

## 2017-08-22 ENCOUNTER — Ambulatory Visit (INDEPENDENT_AMBULATORY_CARE_PROVIDER_SITE_OTHER): Payer: Medicare Other

## 2017-08-22 DIAGNOSIS — E538 Deficiency of other specified B group vitamins: Secondary | ICD-10-CM

## 2017-08-22 MED ORDER — CYANOCOBALAMIN 1000 MCG/ML IJ SOLN
1000.0000 ug | Freq: Once | INTRAMUSCULAR | Status: AC
  Administered 2017-08-22: 1000 ug via INTRAMUSCULAR

## 2017-09-19 ENCOUNTER — Ambulatory Visit (INDEPENDENT_AMBULATORY_CARE_PROVIDER_SITE_OTHER): Payer: Medicare Other | Admitting: Internal Medicine

## 2017-09-19 ENCOUNTER — Encounter: Payer: Self-pay | Admitting: Internal Medicine

## 2017-09-19 VITALS — BP 122/62 | HR 71 | Temp 97.5°F | Ht 74.0 in | Wt 183.0 lb

## 2017-09-19 DIAGNOSIS — J069 Acute upper respiratory infection, unspecified: Secondary | ICD-10-CM | POA: Diagnosis not present

## 2017-09-19 NOTE — Progress Notes (Signed)
Date:  09/19/2017   Name:  Terrance Carrillo   DOB:  23-Jan-1934   MRN:  [id number]   Chief Complaint: Sinusitis (2 weeks- face is full and can't breathe very good. Some coughing- no production. Ears are stuffed up as well. ) and b12 def (Dr. Dorothea Ogle would like to see if we can check b12 since we are giving b12 shots. - Hematologist. )  Sinusitis  Associated symptoms include congestion and sinus pressure. Pertinent negatives include no chills, shortness of breath or sore throat.     Review of Systems  Constitutional: Negative for chills, fatigue and fever.  HENT: Positive for congestion and sinus pressure. Negative for sore throat and trouble swallowing.   Respiratory: Negative for chest tightness, shortness of breath and wheezing.   Cardiovascular: Negative for chest pain and palpitations.  Gastrointestinal: Negative for diarrhea and nausea.    Patient Active Problem List   Diagnosis Date Noted  . Chronic idiopathic constipation 06/18/2017  . Nonproliferative diabetic retinopathy ([place]) 04/12/2017  . Claudication of lower extremity ([place]) 09/06/2016  . BPH with obstruction/lower urinary tract symptoms 01/02/2016  . Recurrent UTI 01/02/2016  . Hiatal hernia 12/09/2015  . Irritable bowel syndrome (IBS) 12/09/2015  . Essential hypertension 12/09/2015  . Idiopathic insomnia 06/30/2015  . Local edema 06/30/2015  . Cardiomyopathy, ischemic 06/30/2015  . History of prolonged Q-T interval on ECG 06/30/2015  . Acid reflux 06/30/2015  . Environmental and seasonal allergies 06/30/2015  . Dyslipidemia 06/30/2015  . Chronic diastolic heart failure ([place]) 06/30/2015  . Acquired hypothyroidism 06/30/2015  . Other long term (current) drug therapy 06/05/2012  . MI (mitral incompetence) 01/24/2012  . Type 2 diabetes mellitus with retinopathy ([place]) 01/24/2012  . Paroxysmal ventricular tachycardia ([place]) 08/23/2011  . Arteriosclerosis of coronary artery 08/23/2011  . Automatic implantable  cardioverter-defibrillator in situ 08/16/2011    Prior to Admission medications   Medication Sig Start Date End Date Taking? Authorizing Provider  aspirin 81 MG tablet Take by mouth. 11/16/10  Yes [provider]  furosemide (LASIX) 20 MG tablet Take 2 tablets (40 mg total) by mouth daily. 03/13/17  Yes Glean Hess, MD  glimepiride (AMARYL) 4 MG tablet TAKE ONE TABLET EVERY MORNING WITH BREAKFAST 07/15/17  Yes Glean Hess, MD  levocetirizine (XYZAL) 5 MG tablet Take 1 tablet (5 mg total) by mouth every evening. 04/12/17  Yes Glean Hess, MD  levothyroxine (SYNTHROID, LEVOTHROID) 25 MCG tablet TAKE 1 TABLET ON AN EMPTY STOMACH WITH A GLASS OF WATER AT LEAST 30 TO 60 MINUTES BEFORE BREAKFAST. 08/06/17  Yes Glean Hess, MD  lisinopril (PRINIVIL,ZESTRIL) 5 MG tablet Take by mouth daily. 08/29/15  Yes [provider]  magnesium oxide (MAG-OX) 400 MG tablet Take 400 mg by mouth daily. 01/22/17  Yes [provider]  metFORMIN (GLUCOPHAGE) 500 MG tablet TAKE ONE TABLET BY MOUTH TWICE DAILY. WITH A MEAL 01/21/17  Yes Glean Hess, MD  Multiple Vitamin (MULTI-VITAMINS) TABS Take by mouth. Reported on 01/16/2016   Yes [provider]  simvastatin (ZOCOR) 40 MG tablet TAKE (1) TABLET BY MOUTH DAILY AT BEDTIME 05/24/17  Yes Glean Hess, MD  SOTALOL AF 80 MG TABS Take 60 mg by mouth 2 (two) times daily.   Yes [provider]    Allergies  Allergen Reactions  . Penicillins Rash  . Sulfa Antibiotics Rash    Full body rash, itching, face turns bright red    Past Surgical History:  Procedure Laterality  Date  . CARDIAC DEFIBRILLATOR PLACEMENT  2014   PSVT  . CATARACT EXTRACTION, BILATERAL    . CORONARY ARTERY BYPASS GRAFT  2011   x3    Social History   Tobacco Use  . Smoking status: Never Smoker  . Smokeless tobacco: Never Used  Substance Use Topics  . Alcohol use: No    Alcohol/week: 0.0 oz  . Drug use: No     Medication  list has been reviewed and updated.  PHQ 2/9 Scores 08/12/2017 08/08/2016 07/08/2015  PHQ - 2 Score 0 0 0    Physical Exam  Constitutional: He is oriented to person, place, and time. He appears well-developed. No distress.  HENT:  Head: Normocephalic and atraumatic.  Right Ear: Tympanic membrane and ear canal normal.  Left Ear: Tympanic membrane and ear canal normal.  Nose: Nose normal. Right sinus exhibits no maxillary sinus tenderness and no frontal sinus tenderness. Left sinus exhibits no maxillary sinus tenderness and no frontal sinus tenderness.  Mouth/Throat: No posterior oropharyngeal edema or posterior oropharyngeal erythema.  Cardiovascular: Normal rate and regular rhythm. Exam reveals distant heart sounds.  Pulmonary/Chest: Effort normal. No respiratory distress. He has decreased breath sounds. He has no wheezes. He has no rhonchi.  Musculoskeletal: Normal range of motion.  Neurological: He is alert and oriented to person, place, and time.  Skin: Skin is warm and dry. No rash noted.  Psychiatric: He has a normal mood and affect. His behavior is normal. Thought content normal.  Nursing note and vitals reviewed.   BP 122/62   Pulse 71   Temp (!) 97.5 F (36.4 C) (Oral)   Ht 6\' 2"  (1.88 m)   Wt 183 lb (83 kg)   SpO2 99%   BMI 23.50 kg/m   Assessment and Plan: 1. Viral URI Supportive care Flonase spray   No orders of the defined types were placed in this encounter.   Partially dictated using Editor, commissioning. Any errors are unintentional.  Halina Maidens, MD [facility]  09/19/2017

## 2017-09-19 NOTE — Patient Instructions (Signed)
Fluticasone (flonase) nasal spray - 2 sprays in each nostril daily

## 2017-09-25 ENCOUNTER — Telehealth: Payer: Self-pay

## 2017-09-25 ENCOUNTER — Encounter: Payer: Self-pay | Admitting: Internal Medicine

## 2017-09-25 ENCOUNTER — Ambulatory Visit (INDEPENDENT_AMBULATORY_CARE_PROVIDER_SITE_OTHER): Payer: Medicare Other

## 2017-09-25 DIAGNOSIS — E538 Deficiency of other specified B group vitamins: Secondary | ICD-10-CM | POA: Diagnosis not present

## 2017-09-25 MED ORDER — CYANOCOBALAMIN 1000 MCG/ML IJ SOLN
1000.0000 ug | Freq: Once | INTRAMUSCULAR | Status: AC
  Administered 2017-09-25: 1000 ug via INTRAMUSCULAR

## 2017-09-25 NOTE — Telephone Encounter (Signed)
Patient wife informed

## 2017-09-25 NOTE — Telephone Encounter (Signed)
Patient came in today for B12 injection. Stated he was seen on the 15th for URI. States he is still having a lot of mucous in chest and back of throat. He is using floanase nasal which is helping. Is there anything else he can take with this to help? He has no fever/ chills.  Please advise.  Patient told me to leave detailed message on VM if I do not reach them when I call about advice.

## 2017-09-25 NOTE — Telephone Encounter (Signed)
He can take Mucinex Plain twice a day to help loosen phlegm.  He can continue flonase as well.

## 2017-09-30 ENCOUNTER — Ambulatory Visit (INDEPENDENT_AMBULATORY_CARE_PROVIDER_SITE_OTHER): Payer: Medicare Other | Admitting: Internal Medicine

## 2017-09-30 ENCOUNTER — Encounter: Payer: Self-pay | Admitting: Internal Medicine

## 2017-09-30 VITALS — BP 108/58 | HR 67 | Temp 97.5°F | Ht 74.0 in | Wt 181.0 lb

## 2017-09-30 DIAGNOSIS — J0100 Acute maxillary sinusitis, unspecified: Secondary | ICD-10-CM

## 2017-09-30 DIAGNOSIS — I5032 Chronic diastolic (congestive) heart failure: Secondary | ICD-10-CM

## 2017-09-30 MED ORDER — DOXYCYCLINE HYCLATE 100 MG PO TABS: 100.0000 mg | ORAL_TABLET | Freq: Two times a day (BID) | ORAL | 0 refills | Status: DC

## 2017-09-30 NOTE — Progress Notes (Signed)
Date:  09/30/2017   Name:  Terrance Carrillo   DOB:  Jun 15, 1934   MRN:  [id number]   Chief Complaint: Sinusitis (Nose and face stuffed up. Facial pressure- headaches. Feels like never got completely better from viral infection on 09/19/2017. When walking feels SOB.  )  Sinusitis  This is a new problem. The current episode started in the past 7 days. The problem has been gradually worsening since onset. There has been no fever. Associated symptoms include congestion, shortness of breath and sinus pressure. Pertinent negatives include no chills, coughing, headaches or sore throat.  Shortness of Breath  This is a recurrent problem. The problem occurs daily. The problem has been gradually worsening. Associated symptoms include leg swelling. Pertinent negatives include no chest pain, fever, headaches, sore throat or wheezing. The symptoms are aggravated by any activity.     Review of Systems  Constitutional: Negative for chills, fatigue and fever.  HENT: Positive for congestion and sinus pressure. Negative for sore throat, tinnitus and trouble swallowing.   Eyes: Negative for visual disturbance.  Respiratory: Positive for shortness of breath. Negative for cough, chest tightness and wheezing.   Cardiovascular: Positive for leg swelling. Negative for chest pain and palpitations.  Gastrointestinal:       Recurrent bloating and gas - relieved by TUMS  Neurological: Negative for dizziness and headaches.    Patient Active Problem List   Diagnosis Date Noted  . Chronic idiopathic constipation 06/18/2017  . Chronic lymphocytic leukemia of B-cell type not having achieved remission ([place]) 05/20/2017  . Anemia, unspecified 05/20/2017  . Nonproliferative diabetic retinopathy ([place]) 04/12/2017  . Claudication of lower extremity ([place]) 09/06/2016  . BPH with obstruction/lower urinary tract symptoms 01/02/2016  . Recurrent UTI 01/02/2016  . Hiatal hernia 12/09/2015  . Irritable bowel syndrome (IBS)  12/09/2015  . Essential hypertension 12/09/2015  . Idiopathic insomnia 06/30/2015  . Local edema 06/30/2015  . Cardiomyopathy, ischemic 06/30/2015  . History of prolonged Q-T interval on ECG 06/30/2015  . Acid reflux 06/30/2015  . Environmental and seasonal allergies 06/30/2015  . Dyslipidemia 06/30/2015  . Chronic diastolic heart failure ([place]) 06/30/2015  . Acquired hypothyroidism 06/30/2015  . Other long term (current) drug therapy 06/05/2012  . MI (mitral incompetence) 01/24/2012  . Type 2 diabetes mellitus with retinopathy ([place]) 01/24/2012  . Paroxysmal ventricular tachycardia ([place]) 08/23/2011  . Arteriosclerosis of coronary artery 08/23/2011  . Automatic implantable cardioverter-defibrillator in situ 08/16/2011    Prior to Admission medications   Medication Sig Start Date End Date Taking? Authorizing Provider  aspirin 81 MG tablet Take by mouth. 11/16/10  Yes [provider]  furosemide (LASIX) 20 MG tablet Take 2 tablets (40 mg total) by mouth daily. 03/13/17  Yes Glean Hess, MD  glimepiride (AMARYL) 4 MG tablet TAKE ONE TABLET EVERY MORNING WITH BREAKFAST 07/15/17  Yes Glean Hess, MD  levocetirizine (XYZAL) 5 MG tablet Take 1 tablet (5 mg total) by mouth every evening. 04/12/17  Yes Glean Hess, MD  levothyroxine (SYNTHROID, LEVOTHROID) 25 MCG tablet TAKE 1 TABLET ON AN EMPTY STOMACH WITH A GLASS OF WATER AT LEAST 30 TO 60 MINUTES BEFORE BREAKFAST. 08/06/17  Yes Glean Hess, MD  lisinopril (PRINIVIL,ZESTRIL) 5 MG tablet Take by mouth daily. 08/29/15  Yes [provider]  magnesium oxide (MAG-OX) 400 MG tablet Take 400 mg by mouth daily. 01/22/17  Yes [provider]  metFORMIN (GLUCOPHAGE) 500 MG tablet TAKE ONE TABLET BY MOUTH TWICE DAILY. WITH A  MEAL 01/21/17  Yes Glean Hess, MD  Multiple Vitamin (MULTI-VITAMINS) TABS Take by mouth. Reported on 01/16/2016   Yes [provider]  simvastatin (ZOCOR) 40 MG tablet TAKE (1)  TABLET BY MOUTH DAILY AT BEDTIME 05/24/17  Yes Glean Hess, MD  SOTALOL AF 80 MG TABS Take 60 mg by mouth 2 (two) times daily.   Yes [provider]    Allergies  Allergen Reactions  . Penicillins Rash  . Sulfa Antibiotics Rash    Full body rash, itching, face turns bright red    Past Surgical History:  Procedure Laterality Date  . CARDIAC DEFIBRILLATOR PLACEMENT  2014   PSVT  . CATARACT EXTRACTION, BILATERAL    . CORONARY ARTERY BYPASS GRAFT  2011   x3    Social History   Tobacco Use  . Smoking status: Never Smoker  . Smokeless tobacco: Never Used  Substance Use Topics  . Alcohol use: No    Alcohol/week: 0.0 oz  . Drug use: No     Medication list has been reviewed and updated.  PHQ 2/9 Scores 08/12/2017 08/08/2016 07/08/2015  PHQ - 2 Score 0 0 0    Physical Exam  Constitutional: He is oriented to person, place, and time. He appears well-developed. No distress.  HENT:  Head: Normocephalic and atraumatic.  Right Ear: Tympanic membrane and ear canal normal.  Left Ear: Tympanic membrane and ear canal normal.  Nose: Right sinus exhibits no maxillary sinus tenderness. Left sinus exhibits no maxillary sinus tenderness.  Mouth/Throat: No posterior oropharyngeal edema or posterior oropharyngeal erythema.  Neck: Normal range of motion. Neck supple. No thyromegaly present.  Cardiovascular: Normal rate, regular rhythm and normal heart sounds.  Pulmonary/Chest: Effort normal. No respiratory distress. He has decreased breath sounds (in both lower lobes).  Musculoskeletal: He exhibits edema (pitting edema to upper tibia bilaterally).  Neurological: He is alert and oriented to person, place, and time.  Skin: Skin is warm and dry. No rash noted.  Psychiatric: He has a normal mood and affect. His behavior is normal. Thought content normal.  Nursing note and vitals reviewed.   BP (!) 108/58   Pulse 67   Temp (!) 97.5 F (36.4 C) (Oral)   Ht 6\' 2"  (1.88 m)   Wt 181  lb (82.1 kg)   SpO2 97%   BMI 23.24 kg/m   Assessment and Plan: 1. Acute maxillary sinusitis, recurrence not specified Resume Flonase NS - doxycycline (VIBRA-TABS) 100 MG tablet; Take 1 tablet (100 mg total) by mouth 2 (two) times daily.  Dispense: 20 tablet; Refill: 0  2. Chronic diastolic heart failure (HCC) Increase lasix to 3 alternating with 2 for the next week then call to report response   Meds ordered this encounter  Medications  . doxycycline (VIBRA-TABS) 100 MG tablet    Sig: Take 1 tablet (100 mg total) by mouth 2 (two) times daily.    Dispense:  20 tablet    Refill:  0    Partially dictated using Editor, commissioning. Any errors are unintentional.  Halina Maidens, MD [facility]  09/30/2017

## 2017-09-30 NOTE — Patient Instructions (Signed)
Take Lasix 2 at night, alternate with 3 at night for the next week to help remove excess fluid.

## 2017-10-01 LAB — CBC WITH DIFFERENTIAL/PLATELET
BASOS ABS: 0.1 10*3/uL (ref 0.0–0.2)
BASOS: 0 %
EOS (ABSOLUTE): 0.6 10*3/uL — AB (ref 0.0–0.4)
Eos: 1 %
HEMATOCRIT: 35.8 % — AB (ref 37.5–51.0)
Hemoglobin: 11.5 g/dL — ABNORMAL LOW (ref 13.0–17.7)
IMMATURE GRANS (ABS): 0 10*3/uL (ref 0.0–0.1)
Immature Granulocytes: 0 %
LYMPHS: 89 %
Lymphocytes Absolute: 43.6 10*3/uL — ABNORMAL HIGH (ref 0.7–3.1)
MCH: 30.8 pg (ref 26.6–33.0)
MCHC: 32.1 g/dL (ref 31.5–35.7)
MCV: 96 fL (ref 79–97)
MONOS ABS: 0.9 10*3/uL (ref 0.1–0.9)
Monocytes: 2 %
NEUTROS ABS: 4 10*3/uL (ref 1.4–7.0)
NEUTROS PCT: 8 %
PLATELETS: 207 10*3/uL (ref 150–379)
RBC: 3.73 x10E6/uL — ABNORMAL LOW (ref 4.14–5.80)
RDW: 14.7 % (ref 12.3–15.4)
WBC: 49.3 10*3/uL (ref 3.4–10.8)

## 2017-10-01 LAB — COMPREHENSIVE METABOLIC PANEL
A/G RATIO: 2.2 (ref 1.2–2.2)
ALK PHOS: 112 IU/L (ref 39–117)
ALT: 6 IU/L (ref 0–44)
AST: 10 IU/L (ref 0–40)
Albumin: 4.6 g/dL (ref 3.5–4.7)
BILIRUBIN TOTAL: 0.6 mg/dL (ref 0.0–1.2)
BUN/Creatinine Ratio: 13 (ref 10–24)
BUN: 19 mg/dL (ref 8–27)
CHLORIDE: 100 mmol/L (ref 96–106)
CO2: 25 mmol/L (ref 20–29)
Calcium: 9.4 mg/dL (ref 8.6–10.2)
Creatinine, Ser: 1.49 mg/dL — ABNORMAL HIGH (ref 0.76–1.27)
GFR calc Af Amer: 50 mL/min/{1.73_m2} — ABNORMAL LOW (ref >59)
GFR, EST NON AFRICAN AMERICAN: 43 mL/min/{1.73_m2} — AB (ref >59)
GLOBULIN, TOTAL: 2.1 g/dL (ref 1.5–4.5)
Glucose: 199 mg/dL — ABNORMAL HIGH (ref 65–99)
POTASSIUM: 4.2 mmol/L (ref 3.5–5.2)
SODIUM: 143 mmol/L (ref 134–144)
Total Protein: 6.7 g/dL (ref 6.0–8.5)

## 2017-10-10 ENCOUNTER — Other Ambulatory Visit: Payer: Self-pay

## 2017-10-10 DIAGNOSIS — J0100 Acute maxillary sinusitis, unspecified: Secondary | ICD-10-CM

## 2017-10-10 MED ORDER — DOXYCYCLINE HYCLATE 100 MG PO TABS: 100.0000 mg | ORAL_TABLET | Freq: Two times a day (BID) | ORAL | 0 refills | Status: DC

## 2017-10-17 ENCOUNTER — Other Ambulatory Visit: Payer: Self-pay | Admitting: Internal Medicine

## 2017-10-17 DIAGNOSIS — E119 Type 2 diabetes mellitus without complications: Secondary | ICD-10-CM

## 2017-10-18 ENCOUNTER — Encounter: Payer: Self-pay | Admitting: Internal Medicine

## 2017-10-18 ENCOUNTER — Ambulatory Visit (INDEPENDENT_AMBULATORY_CARE_PROVIDER_SITE_OTHER): Payer: Medicare Other | Admitting: Internal Medicine

## 2017-10-18 VITALS — BP 98/58 | HR 65 | Ht 74.0 in | Wt 181.0 lb

## 2017-10-18 DIAGNOSIS — R0602 Shortness of breath: Secondary | ICD-10-CM

## 2017-10-18 DIAGNOSIS — J0100 Acute maxillary sinusitis, unspecified: Secondary | ICD-10-CM | POA: Diagnosis not present

## 2017-10-18 MED ORDER — CEFDINIR 300 MG PO CAPS: 300.0000 mg | ORAL_CAPSULE | Freq: Two times a day (BID) | ORAL | 0 refills | Status: AC

## 2017-10-18 NOTE — Progress Notes (Signed)
Date:  10/18/2017   Name:  Terrance Carrillo   DOB:  07/16/1934   MRN:  [id number]   Chief Complaint: Sinusitis (Headache/ congestion- coughing with nothing coming up. If walking short distance giving out and SOB- feels SOB when laying on back. )  Shortness of Breath  This is a chronic problem. The current episode started 1 to 4 weeks ago. The problem occurs daily. The problem has been unchanged. Associated symptoms include rhinorrhea and a sore throat. Pertinent negatives include no chest pain, ear pain, fever, headaches, vomiting or wheezing. The symptoms are aggravated by lying flat.  Sinus Problem  This is a chronic problem. The current episode started 1 to 4 weeks ago. The problem is unchanged. There has been no fever. He is experiencing no pain. Associated symptoms include congestion, coughing, shortness of breath, sinus pressure and a sore throat. Pertinent negatives include no chills, diaphoresis, ear pain or headaches. Past treatments include antibiotics and spray decongestants. The treatment provided mild relief.     Review of Systems  Constitutional: Negative for chills, diaphoresis, fatigue and fever.  HENT: Positive for congestion, postnasal drip, rhinorrhea, sinus pressure and sore throat. Negative for ear pain.   Eyes: Negative for visual disturbance.  Respiratory: Positive for cough, chest tightness and shortness of breath. Negative for wheezing.   Cardiovascular: Negative for chest pain and palpitations.  Gastrointestinal: Negative for nausea and vomiting.  Neurological: Negative for dizziness, syncope and headaches.  Psychiatric/Behavioral: Negative for sleep disturbance.    Patient Active Problem List   Diagnosis Date Noted  . Chronic idiopathic constipation 06/18/2017  . Chronic lymphocytic leukemia of B-cell type not having achieved remission ([place]) 05/20/2017  . Anemia, unspecified 05/20/2017  . Nonproliferative diabetic retinopathy ([place]) 04/12/2017  . Claudication  of lower extremity ([place]) 09/06/2016  . BPH with obstruction/lower urinary tract symptoms 01/02/2016  . Recurrent UTI 01/02/2016  . Hiatal hernia 12/09/2015  . Irritable bowel syndrome (IBS) 12/09/2015  . Essential hypertension 12/09/2015  . Idiopathic insomnia 06/30/2015  . Local edema 06/30/2015  . Cardiomyopathy, ischemic 06/30/2015  . History of prolonged Q-T interval on ECG 06/30/2015  . Acid reflux 06/30/2015  . Environmental and seasonal allergies 06/30/2015  . Dyslipidemia 06/30/2015  . Chronic diastolic heart failure ([place]) 06/30/2015  . Acquired hypothyroidism 06/30/2015  . Other long term (current) drug therapy 06/05/2012  . MI (mitral incompetence) 01/24/2012  . Type 2 diabetes mellitus with retinopathy ([place]) 01/24/2012  . Paroxysmal ventricular tachycardia ([place]) 08/23/2011  . Arteriosclerosis of coronary artery 08/23/2011  . Automatic implantable cardioverter-defibrillator in situ 08/16/2011    Prior to Admission medications   Medication Sig Start Date End Date Taking? Authorizing Provider  aspirin 81 MG tablet Take by mouth. 11/16/10  Yes [provider]  furosemide (LASIX) 20 MG tablet Take 2 tablets (40 mg total) by mouth daily. 03/13/17  Yes Glean Hess, MD  glimepiride (AMARYL) 4 MG tablet TAKE ONE TABLET EVERY MORNING WITH BREAKFAST 07/15/17  Yes Glean Hess, MD  levocetirizine (XYZAL) 5 MG tablet Take 1 tablet (5 mg total) by mouth every evening. 04/12/17  Yes Glean Hess, MD  levothyroxine (SYNTHROID, LEVOTHROID) 25 MCG tablet TAKE 1 TABLET ON AN EMPTY STOMACH WITH A GLASS OF WATER AT LEAST 30 TO 60 MINUTES BEFORE BREAKFAST. 08/06/17  Yes Glean Hess, MD  lisinopril (PRINIVIL,ZESTRIL) 5 MG tablet Take by mouth daily. 08/29/15  Yes [provider]  magnesium oxide (MAG-OX) 400 MG tablet Take 400 mg by  mouth daily. 01/22/17  Yes [provider]  metFORMIN (GLUCOPHAGE) 500 MG tablet TAKE (1) TABLET BY MOUTH TWICE DAILY WITH A  MEAL 10/17/17  Yes Glean Hess, MD  Multiple Vitamin (MULTI-VITAMINS) TABS Take by mouth. Reported on 01/16/2016   Yes [provider]  simvastatin (ZOCOR) 40 MG tablet TAKE (1) TABLET BY MOUTH DAILY AT BEDTIME 05/24/17  Yes Glean Hess, MD  SOTALOL AF 80 MG TABS Take 60 mg by mouth 2 (two) times daily.   Yes [provider]    Allergies  Allergen Reactions  . Penicillins Rash  . Sulfa Antibiotics Rash    Full body rash, itching, face turns bright red    Past Surgical History:  Procedure Laterality Date  . CARDIAC DEFIBRILLATOR PLACEMENT  2014   PSVT  . CATARACT EXTRACTION, BILATERAL    . CORONARY ARTERY BYPASS GRAFT  2011   x3    Social History   Tobacco Use  . Smoking status: Never Smoker  . Smokeless tobacco: Never Used  Substance Use Topics  . Alcohol use: No    Alcohol/week: 0.0 oz  . Drug use: No     Medication list has been reviewed and updated.  PHQ 2/9 Scores 08/12/2017 08/08/2016 07/08/2015  PHQ - 2 Score 0 0 0    Physical Exam  Constitutional: He is oriented to person, place, and time. He appears well-developed and well-nourished.  HENT:  Right Ear: External ear and ear canal normal. Tympanic membrane is not erythematous and not retracted.  Left Ear: External ear and ear canal normal. Tympanic membrane is not erythematous and not retracted.  Nose: Right sinus exhibits maxillary sinus tenderness. Right sinus exhibits no frontal sinus tenderness. Left sinus exhibits maxillary sinus tenderness. Left sinus exhibits no frontal sinus tenderness.  Mouth/Throat: Uvula is midline and mucous membranes are normal. No oral lesions. Posterior oropharyngeal erythema (and yellow drainage) present. No oropharyngeal exudate.  Neck: No JVD present.  Cardiovascular: Normal rate, regular rhythm and normal heart sounds.  Pulmonary/Chest: He has decreased breath sounds. He has no wheezes. He has rhonchi. He has no rales.  Abdominal: Soft. Normal  appearance and bowel sounds are normal. There is no tenderness.  Musculoskeletal: He exhibits edema (mild ankle edema).  Lymphadenopathy:    He has no cervical adenopathy.  Neurological: He is alert and oriented to person, place, and time.    BP (!) 98/58   Pulse 65   Ht 6\' 2"  (1.88 m)   Wt 181 lb (82.1 kg)   SpO2 99%   BMI 23.24 kg/m   Assessment and Plan: 1. Acute maxillary sinusitis, recurrence not specified Continue Flonase - cefdinir (OMNICEF) 300 MG capsule; Take 1 capsule (300 mg total) by mouth 2 (two) times daily for 10 days.  Dispense: 20 capsule; Refill: 0  2. Shortness of breath Appears to be from PND O2 sats good; weight is stable, no significant JVD or change in ankle edema Call in 5 days to report progress  Meds ordered this encounter  Medications  . cefdinir (OMNICEF) 300 MG capsule    Sig: Take 1 capsule (300 mg total) by mouth 2 (two) times daily for 10 days.    Dispense:  20 capsule    Refill:  0    Partially dictated using Editor, commissioning. Any errors are unintentional.  Halina Maidens, MD [facility]  10/18/2017

## 2017-10-25 ENCOUNTER — Ambulatory Visit (INDEPENDENT_AMBULATORY_CARE_PROVIDER_SITE_OTHER): Payer: Medicare Other

## 2017-10-25 DIAGNOSIS — E538 Deficiency of other specified B group vitamins: Secondary | ICD-10-CM | POA: Diagnosis not present

## 2017-10-25 MED ORDER — CYANOCOBALAMIN 1000 MCG/ML IJ SOLN
1000.0000 ug | Freq: Once | INTRAMUSCULAR | Status: AC
  Administered 2017-10-25: 1000 ug via INTRAMUSCULAR

## 2017-10-30 ENCOUNTER — Other Ambulatory Visit: Payer: Self-pay | Admitting: Internal Medicine

## 2017-11-25 ENCOUNTER — Ambulatory Visit: Payer: Medicare Other

## 2017-12-17 ENCOUNTER — Encounter: Payer: Self-pay | Admitting: Internal Medicine

## 2017-12-17 ENCOUNTER — Ambulatory Visit (INDEPENDENT_AMBULATORY_CARE_PROVIDER_SITE_OTHER): Payer: Medicare Other | Admitting: Internal Medicine

## 2017-12-17 VITALS — BP 100/48 | HR 60 | Ht 74.0 in

## 2017-12-17 DIAGNOSIS — I739 Peripheral vascular disease, unspecified: Secondary | ICD-10-CM

## 2017-12-17 DIAGNOSIS — I5032 Chronic diastolic (congestive) heart failure: Secondary | ICD-10-CM

## 2017-12-17 DIAGNOSIS — E538 Deficiency of other specified B group vitamins: Secondary | ICD-10-CM

## 2017-12-17 DIAGNOSIS — I4729 Other ventricular tachycardia: Secondary | ICD-10-CM

## 2017-12-17 DIAGNOSIS — I472 Ventricular tachycardia: Secondary | ICD-10-CM

## 2017-12-17 DIAGNOSIS — E113553 Type 2 diabetes mellitus with stable proliferative diabetic retinopathy, bilateral: Secondary | ICD-10-CM

## 2017-12-17 DIAGNOSIS — C9110 Chronic lymphocytic leukemia of B-cell type not having achieved remission: Secondary | ICD-10-CM

## 2017-12-17 MED ORDER — LEVOTHYROXINE SODIUM 25 MCG PO TABS: ORAL_TABLET | ORAL | 5 refills | Status: AC

## 2017-12-17 MED ORDER — SPIRONOLACTONE 25 MG PO TABS: 25.0000 mg | ORAL_TABLET | Freq: Every day | ORAL | 5 refills | Status: DC

## 2017-12-17 MED ORDER — CYANOCOBALAMIN 1000 MCG/ML IJ SOLN
1000.0000 ug | Freq: Once | INTRAMUSCULAR | Status: AC
Start: 2017-12-17 — End: 2017-12-17
  Administered 2017-12-17: 1000 ug via INTRAMUSCULAR

## 2017-12-17 NOTE — Patient Instructions (Signed)
Continue Spironolactone  May need to reduce dose of metformin - will let you know when lab for diabetes returns.

## 2017-12-17 NOTE — Progress Notes (Signed)
Date:  12/17/2017   Name:  Terrance Carrillo   DOB:  28-Apr-1934   MRN:  [id number]   Chief Complaint: Diabetes (BS last check was 130. ) Diabetes  He presents for his follow-up diabetic visit. He has type 2 diabetes mellitus. Pertinent negatives for hypoglycemia include no dizziness or headaches. Associated symptoms include fatigue and weakness. Pertinent negatives for diabetes include no chest pain. Current diabetic treatment includes oral agent (dual therapy) (may need to cut back dose of metformin due to RI). He is compliant with treatment most of the time.   CHF - he was hospitalized last month with collapse.  Defib fired.  His medications were changed - lasix stopped temporarily and low dose spironolactone added.  He had to resume lasix and recently increased the dose again.  Still having some swelling.  No doing much - only walking in the house.  Has not started cardiac rehab yet.  Vtach -  S/p defib firing.  Now planning to get the device replaced with and AICD/pacemaker.  CLL -  WBC stable ~ 50K.  His iron was low in the hospital and was given an iron infusion.  PVD - having more leg weakness that is limiting his ambulation.  He has not seen the vascular MD recently.     Lab Results  Component Value Date   HGBA1C 7.7 (H) 08/12/2017     Review of Systems  Constitutional: Positive for fatigue. Negative for chills, diaphoresis and fever.  Respiratory: Positive for shortness of breath. Negative for cough, chest tightness and wheezing.   Cardiovascular: Positive for leg swelling. Negative for chest pain and palpitations.  Gastrointestinal: Negative for abdominal pain, constipation and diarrhea.  Neurological: Positive for weakness and light-headedness. Negative for dizziness and headaches.  Psychiatric/Behavioral: Negative for sleep disturbance.    Patient Active Problem List   Diagnosis Date Noted  . Chronic idiopathic constipation 06/18/2017  . Chronic lymphocytic leukemia of  B-cell type not having achieved remission ([place]) 05/20/2017  . Anemia, unspecified 05/20/2017  . Nonproliferative diabetic retinopathy ([place]) 04/12/2017  . Claudication of lower extremity ([place]) 09/06/2016  . BPH with obstruction/lower urinary tract symptoms 01/02/2016  . Recurrent UTI 01/02/2016  . Hiatal hernia 12/09/2015  . Irritable bowel syndrome (IBS) 12/09/2015  . Essential hypertension 12/09/2015  . Idiopathic insomnia 06/30/2015  . Local edema 06/30/2015  . Cardiomyopathy, ischemic 06/30/2015  . History of prolonged Q-T interval on ECG 06/30/2015  . Acid reflux 06/30/2015  . Environmental and seasonal allergies 06/30/2015  . Dyslipidemia 06/30/2015  . Chronic diastolic heart failure ([place]) 06/30/2015  . Acquired hypothyroidism 06/30/2015  . Other long term (current) drug therapy 06/05/2012  . MI (mitral incompetence) 01/24/2012  . Type 2 diabetes mellitus with retinopathy ([place]) 01/24/2012  . Paroxysmal ventricular tachycardia ([place]) 08/23/2011  . Arteriosclerosis of coronary artery 08/23/2011  . Automatic implantable cardioverter-defibrillator in situ 08/16/2011    Prior to Admission medications   Medication Sig Start Date End Date Taking? Authorizing Provider  amiodarone (PACERONE) 400 MG tablet Take 400 mg by mouth daily.   Yes [provider]  aspirin 81 MG tablet Take by mouth. 11/16/10  Yes [provider]  atorvastatin (LIPITOR) 40 MG tablet Take 40 mg by mouth daily.   Yes [provider]  furosemide (LASIX) 20 MG tablet TAKE (2) TABLETS BY MOUTH EVERY DAY Patient taking differently: TAKE (2) TABLETS BY MOUTH EVERY MORNING, AND 1 TABLET IN THE AFTERNOON 10/30/17  Yes Glean Hess, MD  glimepiride (AMARYL) 4 MG tablet TAKE ONE TABLET EVERY MORNING WITH BREAKFAST 07/15/17  Yes Glean Hess, MD  levocetirizine (XYZAL) 5 MG tablet Take 1 tablet (5 mg total) by mouth every evening. 04/12/17  Yes Glean Hess, MD  levothyroxine (SYNTHROID,  LEVOTHROID) 25 MCG tablet TAKE 1 TABLET ON AN EMPTY STOMACH WITH A GLASS OF WATER AT LEAST 30 TO 60 MINUTES BEFORE BREAKFAST. 08/06/17  Yes Glean Hess, MD  magnesium oxide (MAG-OX) 400 MG tablet Take 400 mg by mouth daily. 01/22/17  Yes [provider]  metFORMIN (GLUCOPHAGE) 500 MG tablet TAKE (1) TABLET BY MOUTH TWICE DAILY WITH A MEAL 10/17/17  Yes Glean Hess, MD  Multiple Vitamin (MULTI-VITAMINS) TABS Take by mouth. Reported on 01/16/2016   Yes [provider]  propranolol (INNOPRAN XL) 120 MG 24 hr capsule Take 120 mg by mouth at bedtime.   Yes [provider]  spironolactone (ALDACTONE) 25 MG tablet Take 25 mg by mouth daily.   Yes [provider]    Allergies  Allergen Reactions  . Penicillins Rash  . Sulfa Antibiotics Rash    Full body rash, itching, face turns bright red    Past Surgical History:  Procedure Laterality Date  . CARDIAC DEFIBRILLATOR PLACEMENT  2014   PSVT  . CATARACT EXTRACTION, BILATERAL    . CORONARY ARTERY BYPASS GRAFT  2011   x3    Social History   Tobacco Use  . Smoking status: Never Smoker  . Smokeless tobacco: Never Used  Substance Use Topics  . Alcohol use: No    Alcohol/week: 0.0 oz  . Drug use: No     Medication list has been reviewed and updated.  PHQ 2/9 Scores 08/12/2017 08/08/2016 07/08/2015  PHQ - 2 Score 0 0 0    Physical Exam  Constitutional: He is oriented to person, place, and time. He appears well-developed. No distress.  HENT:  Head: Normocephalic and atraumatic.  Neck: Normal range of motion. Neck supple.  Cardiovascular: Normal rate, regular rhythm and normal heart sounds. Exam reveals decreased pulses.  Pulmonary/Chest: Effort normal. No respiratory distress. He has rales (both bases).  Abdominal: He exhibits no distension.  Musculoskeletal: He exhibits edema (RLE).  Neurological: He is alert and oriented to person, place, and time.  Skin: Skin is warm and dry. No rash noted.   Psychiatric: He has a normal mood and affect. His behavior is normal. Thought content normal.  Nursing note and vitals reviewed.   BP (!) 100/48   Pulse 60   Ht 6\' 2"  (1.88 m)   SpO2 97%   BMI 23.24 kg/m   Assessment and Plan: 1. Type 2 diabetes mellitus with stable proliferative retinopathy of both eyes, without long-term current use of insulin ([place]) May cut back metformin to once a day if A1C is good - Hemoglobin A1c  2. Chronic diastolic heart failure (HCC) Continue current regimen Adjust Lasix as instructed by Cardiology - spironolactone (ALDACTONE) 25 MG tablet; Take 1 tablet (25 mg total) by mouth daily.  Dispense: 15 tablet; Refill: 5  3. Claudication of lower extremity ([place]) Recommend starting rehab soon  4. Chronic lymphocytic leukemia of B-cell type not having achieved remission (HCC) Stable, s/p iron infusion  5. Paroxysmal ventricular tachycardia ([place]) Planning AICD replacement  6. B12 deficiency Injections monthly - cyanocobalamin ((VITAMIN B-12)) injection 1,000 mcg   Meds ordered this encounter  Medications  . spironolactone (ALDACTONE) 25 MG tablet    Sig: Take 1 tablet (25  mg total) by mouth daily.    Dispense:  15 tablet    Refill:  5  . levothyroxine (SYNTHROID, LEVOTHROID) 25 MCG tablet    Sig: TAKE 1 TABLET ON AN EMPTY STOMACH WITH A GLASS OF WATER AT LEAST 30 TO 60 MINUTES BEFORE BREAKFAST.    Dispense:  30 tablet    Refill:  5  . cyanocobalamin ((VITAMIN B-12)) injection 1,000 mcg    Partially dictated using Editor, commissioning. Any errors are unintentional.  Halina Maidens, MD [facility]  12/17/2017

## 2017-12-18 ENCOUNTER — Other Ambulatory Visit: Payer: Self-pay | Admitting: Internal Medicine

## 2017-12-18 LAB — HEMOGLOBIN A1C
Est. average glucose Bld gHb Est-mCnc: 183 mg/dL
Hgb A1c MFr Bld: 8 % — ABNORMAL HIGH (ref 4.8–5.6)

## 2017-12-19 ENCOUNTER — Ambulatory Visit: Payer: Medicare Other

## 2017-12-23 ENCOUNTER — Other Ambulatory Visit: Payer: Self-pay | Admitting: Internal Medicine

## 2017-12-23 DIAGNOSIS — I5032 Chronic diastolic (congestive) heart failure: Secondary | ICD-10-CM

## 2017-12-23 MED ORDER — SPIRONOLACTONE 25 MG PO TABS: 12.5000 mg | ORAL_TABLET | Freq: Every day | ORAL | 5 refills | Status: AC

## 2017-12-26 ENCOUNTER — Ambulatory Visit: Payer: Medicare Other

## 2018-01-01 ENCOUNTER — Encounter: Payer: Self-pay | Admitting: Internal Medicine

## 2018-01-01 ENCOUNTER — Ambulatory Visit (INDEPENDENT_AMBULATORY_CARE_PROVIDER_SITE_OTHER): Payer: Medicare Other | Admitting: Internal Medicine

## 2018-01-01 VITALS — BP 110/54 | HR 54 | Temp 97.4°F | Ht 74.0 in | Wt 177.0 lb

## 2018-01-01 DIAGNOSIS — J0100 Acute maxillary sinusitis, unspecified: Secondary | ICD-10-CM | POA: Diagnosis not present

## 2018-01-01 DIAGNOSIS — I5032 Chronic diastolic (congestive) heart failure: Secondary | ICD-10-CM | POA: Diagnosis not present

## 2018-01-01 MED ORDER — DOXYCYCLINE HYCLATE 100 MG PO TABS: 100.0000 mg | ORAL_TABLET | Freq: Two times a day (BID) | ORAL | 0 refills | Status: AC

## 2018-01-01 NOTE — Progress Notes (Signed)
Date:  01/01/2018   Name:  Terrance Carrillo   DOB:  June 28, 1934   MRN:  [id number]   Chief Complaint: Cough (Was seen two weeks ago. Still not better. States when laying down he can hear his chest rattling. Coughing/ wheezing- no mucous will come up. )  Cough  This is a new problem. The current episode started in the past 7 days. The problem has been unchanged. The problem occurs every few hours. The cough is non-productive. Associated symptoms include nasal congestion, postnasal drip and shortness of breath. Pertinent negatives include no chest pain, chills, ear congestion, ear pain, fever, sore throat or wheezing.   Edema - wife has been hearing crackles in his lungs at night.  He is having more ankle edema.  His weight has only been up one lb this week so they have not given him more lasix.  His appetite is not good - he says that nothing tastes good.  He may have lost some weight so that his "dry weight" is different.   Review of Systems  Constitutional: Negative for chills and fever.  HENT: Positive for postnasal drip. Negative for ear pain and sore throat.   Respiratory: Positive for cough and shortness of breath. Negative for wheezing.   Cardiovascular: Positive for leg swelling. Negative for chest pain.    Patient Active Problem List   Diagnosis Date Noted  . Chronic idiopathic constipation 06/18/2017  . Chronic lymphocytic leukemia of B-cell type not having achieved remission ([place]) 05/20/2017  . Anemia, unspecified 05/20/2017  . Nonproliferative diabetic retinopathy ([place]) 04/12/2017  . Claudication of lower extremity ([place]) 09/06/2016  . BPH with obstruction/lower urinary tract symptoms 01/02/2016  . Recurrent UTI 01/02/2016  . Hiatal hernia 12/09/2015  . Irritable bowel syndrome (IBS) 12/09/2015  . Essential hypertension 12/09/2015  . Idiopathic insomnia 06/30/2015  . Local edema 06/30/2015  . Cardiomyopathy, ischemic 06/30/2015  . History of prolonged Q-T interval on ECG  06/30/2015  . Acid reflux 06/30/2015  . Environmental and seasonal allergies 06/30/2015  . Dyslipidemia 06/30/2015  . Chronic diastolic heart failure ([place]) 06/30/2015  . Acquired hypothyroidism 06/30/2015  . Other long term (current) drug therapy 06/05/2012  . MI (mitral incompetence) 01/24/2012  . Type 2 diabetes mellitus with retinopathy ([place]) 01/24/2012  . Paroxysmal ventricular tachycardia ([place]) 08/23/2011  . Arteriosclerosis of coronary artery 08/23/2011  . Automatic implantable cardioverter-defibrillator in situ 08/16/2011    Prior to Admission medications   Medication Sig Start Date End Date Taking? Authorizing Provider  amiodarone (PACERONE) 400 MG tablet Take 400 mg by mouth daily.   Yes [provider]  aspirin 81 MG tablet Take by mouth. 11/16/10  Yes [provider]  atorvastatin (LIPITOR) 40 MG tablet Take 40 mg by mouth daily.   Yes [provider]  furosemide (LASIX) 20 MG tablet TAKE (2) TABLETS BY MOUTH EVERY DAY Patient taking differently: TAKE (2) TABLETS BY MOUTH EVERY MORNING, AND 1 TABLET IN THE AFTERNOON 10/30/17  Yes Glean Hess, MD  glimepiride (AMARYL) 4 MG tablet TAKE ONE TABLET EVERY MORNING WITH BREAKFAST 07/15/17  Yes Glean Hess, MD  levocetirizine (XYZAL) 5 MG tablet Take 1 tablet (5 mg total) by mouth every evening. 04/12/17  Yes Glean Hess, MD  levothyroxine (SYNTHROID, LEVOTHROID) 25 MCG tablet TAKE 1 TABLET ON AN EMPTY STOMACH WITH A GLASS OF WATER AT LEAST 30 TO 60 MINUTES BEFORE BREAKFAST. 12/17/17  Yes Glean Hess, MD  magnesium oxide (MAG-OX) 400 MG  tablet Take 400 mg by mouth daily. 01/22/17  Yes [provider]  metFORMIN (GLUCOPHAGE) 500 MG tablet TAKE (1) TABLET BY MOUTH TWICE DAILY WITH A MEAL 10/17/17  Yes Glean Hess, MD  Multiple Vitamin (MULTI-VITAMINS) TABS Take by mouth. Reported on 01/16/2016   Yes [provider]  propranolol ER (INDERAL LA) 120 MG 24 hr capsule TAKE  ONE (1) CAPSULE EACH DAY. 12/18/17  Yes Glean Hess, MD  spironolactone (ALDACTONE) 25 MG tablet Take 0.5 tablets (12.5 mg total) by mouth daily. 12/23/17  Yes Glean Hess, MD    Allergies  Allergen Reactions  . Penicillins Rash  . Sulfa Antibiotics Rash    Full body rash, itching, face turns bright red    Past Surgical History:  Procedure Laterality Date  . CARDIAC DEFIBRILLATOR PLACEMENT  2014   PSVT  . CATARACT EXTRACTION, BILATERAL    . CORONARY ARTERY BYPASS GRAFT  2011   x3    Social History   Tobacco Use  . Smoking status: Never Smoker  . Smokeless tobacco: Never Used  Substance Use Topics  . Alcohol use: No    Alcohol/week: 0.0 oz  . Drug use: No     Medication list has been reviewed and updated.  PHQ 2/9 Scores 08/12/2017 08/08/2016 07/08/2015  PHQ - 2 Score 0 0 0    Physical Exam  Constitutional: He is oriented to person, place, and time. He appears well-developed. No distress.  HENT:  Head: Normocephalic and atraumatic.  Right Ear: Tympanic membrane and ear canal normal.  Left Ear: Tympanic membrane and ear canal normal.  Nose: Right sinus exhibits no maxillary sinus tenderness and no frontal sinus tenderness. Left sinus exhibits no maxillary sinus tenderness and no frontal sinus tenderness.  Mouth/Throat: No posterior oropharyngeal edema or posterior oropharyngeal erythema.  Cardiovascular: Normal rate, regular rhythm and normal heart sounds.  Pulmonary/Chest: Effort normal. No respiratory distress. He has rales in the right lower field and the left lower field.  Musculoskeletal: Normal range of motion. He exhibits edema.  Neurological: He is alert and oriented to person, place, and time.  Skin: Skin is warm and dry. No rash noted.  Psychiatric: He has a normal mood and affect. His behavior is normal. Thought content normal.  Nursing note and vitals reviewed.   BP (!) 110/54   Pulse (!) 54   Temp (!) 97.4 F (36.3 C) (Oral)   Ht 6\' 2"  (1.88  m)   Wt 177 lb (80.3 kg)   SpO2 97%   BMI 22.73 kg/m   Assessment and Plan: 1. Acute non-recurrent maxillary sinusitis Continue robitussin - doxycycline (VIBRA-TABS) 100 MG tablet; Take 1 tablet (100 mg total) by mouth 2 (two) times daily for 10 days.  Dispense: 20 tablet; Refill: 0  2. Chronic diastolic heart failure (HCC) Take 2 lasix in the afternoon for the next 3 days Call if weight continues to increase Samples of Ensure given - he needs to increase his protein intake   Meds ordered this encounter  Medications  . doxycycline (VIBRA-TABS) 100 MG tablet    Sig: Take 1 tablet (100 mg total) by mouth 2 (two) times daily for 10 days.    Dispense:  20 tablet    Refill:  0    Partially dictated using Editor, commissioning. Any errors are unintentional.  Halina Maidens, MD [facility]  01/01/2018

## 2018-01-01 NOTE — Patient Instructions (Signed)
Take an extra Lasix every afternoon for the next three days  Continue the over the counter cough syrup that contains only Guaifenesin and Dextromethorphan.

## 2018-01-03 ENCOUNTER — Other Ambulatory Visit: Payer: Self-pay | Admitting: Internal Medicine

## 2018-01-03 ENCOUNTER — Telehealth: Payer: Self-pay

## 2018-01-03 MED ORDER — GUAIFENESIN-CODEINE 100-10 MG/5ML PO SYRP: 5.0000 mL | ORAL_SOLUTION | Freq: Three times a day (TID) | ORAL | 0 refills | Status: DC | PRN

## 2018-01-03 NOTE — Telephone Encounter (Signed)
Spoke to wife and informed sending in cough medication with codeine. If he gets worse over weekend please see UC. She verbalized understanding.

## 2018-01-03 NOTE — Telephone Encounter (Signed)
Patient wife calling stating his cough is becoming severe to the point of him not able to catch his breathe at times. Taking antibiotics as prescribed but wanted to know if we can send in anything for this cough?  - Please Advise.

## 2018-01-03 NOTE — Telephone Encounter (Signed)
I can send him Robitussin with codeine.

## 2018-01-07 ENCOUNTER — Telehealth: Payer: Self-pay | Admitting: Internal Medicine

## 2018-01-07 MED ORDER — DOBUTAMINE IN D5W 4-5 MG/ML-% IV SOLN
7.50 | INTRAVENOUS | Status: DC
Start: ? — End: 2018-01-07

## 2018-01-07 MED ORDER — GLUCAGON HCL RDNA (DIAGNOSTIC) 1 MG IJ SOLR
1.00 | INTRAMUSCULAR | Status: DC
Start: ? — End: 2018-01-07

## 2018-01-07 MED ORDER — DEXTROSE 50 % IV SOLN
12.50 | INTRAVENOUS | Status: DC
Start: ? — End: 2018-01-07

## 2018-01-07 MED ORDER — GENERIC EXTERNAL MEDICATION
1.00 | Status: DC
Start: 2018-01-08 — End: 2018-01-07

## 2018-01-07 MED ORDER — DEXTROSE 10 % IV SOLN
INTRAVENOUS | Status: DC
Start: ? — End: 2018-01-07

## 2018-01-07 MED ORDER — GENERIC EXTERNAL MEDICATION
Status: DC
Start: ? — End: 2018-01-07

## 2018-01-07 MED ORDER — LORAZEPAM 2 MG/ML IJ SOLN
.50 | INTRAMUSCULAR | Status: DC
Start: ? — End: 2018-01-07

## 2018-01-07 MED ORDER — LIDOCAINE 5 % EX PTCH
1.00 | MEDICATED_PATCH | CUTANEOUS | Status: DC
Start: 2018-01-08 — End: 2018-01-07

## 2018-01-07 MED ORDER — LEVOTHYROXINE SODIUM 25 MCG PO TABS
25.00 | ORAL_TABLET | ORAL | Status: DC
Start: 2018-01-08 — End: 2018-01-07

## 2018-01-07 MED ORDER — LIDOCAINE HCL 1 % IJ SOLN
3.00 | INTRAMUSCULAR | Status: DC
Start: ? — End: 2018-01-07

## 2018-01-07 MED ORDER — ASPIRIN 81 MG PO CHEW
81.00 | CHEWABLE_TABLET | ORAL | Status: DC
Start: 2018-01-08 — End: 2018-01-07

## 2018-01-07 MED ORDER — HEPARIN SODIUM (PORCINE) 5000 UNIT/ML IJ SOLN
5000.00 | INTRAMUSCULAR | Status: DC
Start: 2018-01-07 — End: 2018-01-07

## 2018-01-07 NOTE — Telephone Encounter (Signed)
Informed Dr Army Melia- thank you.

## 2018-01-07 NOTE — Telephone Encounter (Signed)
Wife called to say Terrance Carrillo is really sick in the hospital at [facility]

## 2018-01-13 ENCOUNTER — Telehealth: Payer: Self-pay

## 2018-01-13 NOTE — Telephone Encounter (Signed)
Received call from pt requesting to schedule a hosp f/u appt. Discharge summary reviewed with Dr. Ronnald Ramp (covering provider for Dr. Army Melia during her absence). Because pt is followed by cardiology, Dr. Ronnald Ramp states it is unnecessary for pt to be seen for hosp f/u at this time. Advised pt keep f/u appt with cardiologist and to schedule an OV with Dr. Army Melia (soonest availability) upon her return to the office. Care for cardiogenic shock will be assumed by cardiology. In light of our conversation, a TCM call was not placed.

## 2018-01-17 ENCOUNTER — Ambulatory Visit: Payer: Medicare Other | Admitting: Urology

## 2018-02-04 ENCOUNTER — Encounter: Payer: Self-pay | Admitting: Internal Medicine

## 2018-02-04 ENCOUNTER — Ambulatory Visit (INDEPENDENT_AMBULATORY_CARE_PROVIDER_SITE_OTHER): Payer: Medicare Other | Admitting: Internal Medicine

## 2018-02-04 VITALS — BP 118/52 | HR 73 | Ht 74.0 in

## 2018-02-04 DIAGNOSIS — E113553 Type 2 diabetes mellitus with stable proliferative diabetic retinopathy, bilateral: Secondary | ICD-10-CM | POA: Diagnosis not present

## 2018-02-04 DIAGNOSIS — I5032 Chronic diastolic (congestive) heart failure: Secondary | ICD-10-CM

## 2018-02-04 NOTE — Progress Notes (Signed)
Date:  02/04/2018   Name:  Terrance Carrillo   DOB:  [birth year]   MRN:  [id number]   Chief Complaint: Hospitalization Follow-up and Diabetes (Sugars running high since no longer taking metformin or glipizide medication. )  Diabetes  He presents for his follow-up diabetic visit. He has type 2 diabetes mellitus. His disease course has been worsening. Pertinent negatives for hypoglycemia include no dizziness or headaches. Associated symptoms include polyuria. Pertinent negatives for diabetes include no chest pain and no polydipsia. His weight is decreasing steadily. He monitors blood glucose at home 1-2 x per day. His home blood glucose trend is increasing steadily. His breakfast blood glucose is taken between 8-9 am. His breakfast blood glucose range is generally >200 mg/dl.   CHF - now on IV dobutamine via right PICC line.  Weight is stable - actually slightly decreased.  On metaxlone and torsemide.   Review of Systems  Constitutional: Positive for appetite change and unexpected weight change. Negative for diaphoresis.  Respiratory: Positive for shortness of breath. Negative for choking, chest tightness and wheezing.   Cardiovascular: Negative for chest pain and leg swelling.  Endocrine: Positive for polyuria. Negative for polydipsia.  Neurological: Negative for dizziness and headaches.  Psychiatric/Behavioral: Negative for sleep disturbance.    Patient Active Problem List   Diagnosis Date Noted  . Chronic idiopathic constipation 06/18/2017  . Chronic lymphocytic leukemia of B-cell type not having achieved remission ([place]) 05/20/2017  . Anemia, unspecified 05/20/2017  . Nonproliferative diabetic retinopathy ([place]) 04/12/2017  . Claudication of lower extremity ([place]) 09/06/2016  . BPH with obstruction/lower urinary tract symptoms 01/02/2016  . Recurrent UTI 01/02/2016  . Hiatal hernia 12/09/2015  . Irritable bowel syndrome (IBS) 12/09/2015  . Essential hypertension 12/09/2015  . Idiopathic  insomnia 06/30/2015  . Local edema 06/30/2015  . Cardiomyopathy, ischemic 06/30/2015  . History of prolonged Q-T interval on ECG 06/30/2015  . Acid reflux 06/30/2015  . Environmental and seasonal allergies 06/30/2015  . Dyslipidemia 06/30/2015  . Chronic diastolic heart failure ([place]) 06/30/2015  . Acquired hypothyroidism 06/30/2015  . Other long term (current) drug therapy 06/05/2012  . MI (mitral incompetence) 01/24/2012  . Type 2 diabetes mellitus with retinopathy ([place]) 01/24/2012  . Paroxysmal ventricular tachycardia ([place]) 08/23/2011  . Arteriosclerosis of coronary artery 08/23/2011  . Automatic implantable cardioverter-defibrillator in situ 08/16/2011    Prior to Admission medications   Medication Sig Start Date End Date Taking? Authorizing Provider  amiodarone (PACERONE) 400 MG tablet Take 200 mg by mouth 2 (two) times daily.    Yes [provider]  aspirin 81 MG tablet Take by mouth. 11/16/10  Yes [provider]  levothyroxine (SYNTHROID, LEVOTHROID) 25 MCG tablet TAKE 1 TABLET ON AN EMPTY STOMACH WITH A GLASS OF WATER AT LEAST 30 TO 60 MINUTES BEFORE BREAKFAST. 12/17/17  Yes Glean Hess, MD  magnesium oxide (MAG-OX) 400 MG tablet Take 400 mg by mouth daily. 01/22/17  Yes [provider]  Multiple Vitamin (MULTI-VITAMINS) TABS Take by mouth. Reported on 01/16/2016   Yes [provider]  spironolactone (ALDACTONE) 25 MG tablet Take 0.5 tablets (12.5 mg total) by mouth daily. 12/23/17  Yes Glean Hess, MD  torsemide (DEMADEX) 20 MG tablet Take 20 mg by mouth 2 (two) times daily.   Yes [provider]    Allergies  Allergen Reactions  . Penicillins Rash  . Sulfa Antibiotics Rash    Full body rash, itching, face turns bright red    Past  Surgical History:  Procedure Laterality Date  . CARDIAC DEFIBRILLATOR PLACEMENT  2014   PSVT  . CATARACT EXTRACTION, BILATERAL    . CORONARY ARTERY BYPASS GRAFT  2011   x3    Social  History   Tobacco Use  . Smoking status: Never Smoker  . Smokeless tobacco: Never Used  Substance Use Topics  . Alcohol use: No    Alcohol/week: 0.0 oz  . Drug use: No     Medication list has been reviewed and updated.  PHQ 2/9 Scores 08/12/2017 08/08/2016 07/08/2015  PHQ - 2 Score 0 0 0    Physical Exam  Constitutional: He is oriented to person, place, and time. He appears well-developed. No distress.  Cardiovascular: Normal rate and regular rhythm. Exam reveals distant heart sounds.  Pulmonary/Chest: He has no decreased breath sounds. He has no wheezes. He has no rhonchi.  Abdominal: Soft. Normal appearance.  Musculoskeletal: He exhibits no edema or tenderness.  Neurological: He is alert and oriented to person, place, and time.  Skin:       BP (!) 118/52   Pulse 73   Ht 6\' 2"  (1.88 m)   SpO2 99%   BMI 22.73 kg/m   Assessment and Plan: 1. Chronic diastolic heart failure (HCC) Now on dobutamine infusion at home Weight gradually decreasing - may be slightly over-diuresed - CBC with Differential/Platelet - Comprehensive metabolic panel - Magnesium  2. Type 2 diabetes mellitus with stable proliferative retinopathy of both eyes, without long-term current use of insulin (HCC) Begin low dose basal insulin - Basaglar 5 units per day Wife instructed in use and sample given Call in 2-3 weeks to report blood sugars - Comprehensive metabolic panel   No orders of the defined types were placed in this encounter.   Partially dictated using Editor, commissioning. Any errors are unintentional.  Halina Maidens, MD [facility]  02/04/2018

## 2018-02-04 NOTE — Patient Instructions (Addendum)
Basaglar 5 units every morning  Check blood sugar and call me with the readings in about 2-3 weeks

## 2018-02-05 ENCOUNTER — Other Ambulatory Visit: Payer: Self-pay

## 2018-02-05 LAB — CBC WITH DIFFERENTIAL/PLATELET
BASOS ABS: 0.1 10*3/uL (ref 0.0–0.2)
Basos: 0 %
EOS (ABSOLUTE): 0.3 10*3/uL (ref 0.0–0.4)
Eos: 1 %
Hematocrit: 33.9 % — ABNORMAL LOW (ref 37.5–51.0)
Hemoglobin: 10.3 g/dL — ABNORMAL LOW (ref 13.0–17.7)
IMMATURE GRANULOCYTES: 0 %
Immature Grans (Abs): 0 10*3/uL (ref 0.0–0.1)
LYMPHS: 86 %
Lymphocytes Absolute: 35.9 10*3/uL — ABNORMAL HIGH (ref 0.7–3.1)
MCH: 29.9 pg (ref 26.6–33.0)
MCHC: 30.4 g/dL — ABNORMAL LOW (ref 31.5–35.7)
MCV: 99 fL — ABNORMAL HIGH (ref 79–97)
Monocytes Absolute: 1 10*3/uL — ABNORMAL HIGH (ref 0.1–0.9)
Monocytes: 2 %
NEUTROS PCT: 11 %
Neutrophils Absolute: 4.6 10*3/uL (ref 1.4–7.0)
PLATELETS: 176 10*3/uL (ref 150–379)
RBC: 3.44 x10E6/uL — AB (ref 4.14–5.80)
RDW: 16.8 % — AB (ref 12.3–15.4)
WBC: 41.9 10*3/uL — AB (ref 3.4–10.8)

## 2018-02-05 LAB — COMPREHENSIVE METABOLIC PANEL
ALK PHOS: 132 IU/L — AB (ref 39–117)
ALT: 22 IU/L (ref 0–44)
AST: 21 IU/L (ref 0–40)
Albumin/Globulin Ratio: 1.8 (ref 1.2–2.2)
Albumin: 4.3 g/dL (ref 3.5–4.7)
BUN/Creatinine Ratio: 20 (ref 10–24)
BUN: 49 mg/dL — ABNORMAL HIGH (ref 8–27)
Bilirubin Total: 0.7 mg/dL (ref 0.0–1.2)
CO2: 27 mmol/L (ref 20–29)
CREATININE: 2.48 mg/dL — AB (ref 0.76–1.27)
Calcium: 9.2 mg/dL (ref 8.6–10.2)
Chloride: 92 mmol/L — ABNORMAL LOW (ref 96–106)
GFR calc Af Amer: 27 mL/min/{1.73_m2} — ABNORMAL LOW (ref >59)
GFR, EST NON AFRICAN AMERICAN: 23 mL/min/{1.73_m2} — AB (ref >59)
Globulin, Total: 2.4 g/dL (ref 1.5–4.5)
Glucose: 339 mg/dL — ABNORMAL HIGH (ref 65–99)
POTASSIUM: 4.4 mmol/L (ref 3.5–5.2)
Sodium: 137 mmol/L (ref 134–144)
Total Protein: 6.7 g/dL (ref 6.0–8.5)

## 2018-02-05 LAB — MAGNESIUM: MAGNESIUM: 2.1 mg/dL (ref 1.6–2.3)

## 2018-02-05 MED ORDER — TORSEMIDE 20 MG PO TABS: ORAL_TABLET | ORAL | 0 refills | Status: DC

## 2018-02-06 ENCOUNTER — Other Ambulatory Visit: Payer: Self-pay

## 2018-02-06 ENCOUNTER — Telehealth: Payer: Self-pay

## 2018-02-06 DIAGNOSIS — K602 Anal fissure, unspecified: Secondary | ICD-10-CM

## 2018-02-06 MED ORDER — HYDROCORTISONE 2.5 % RE CREA
1.0000 | TOPICAL_CREAM | Freq: Two times a day (BID) | RECTAL | 0 refills | Status: AC
Start: 2018-02-06 — End: ?

## 2018-02-06 NOTE — Telephone Encounter (Signed)
Take the insulin as described.  It did not make his BS go up - its coincidental.

## 2018-02-06 NOTE — Telephone Encounter (Signed)
Patient wife called stating patient started NEW insulin given in our office and his BS this morning is the highest it has ever been at 332. Never before has his BS been over 200. She wants advise on what to do about this medication and whether he should continue it and take it today?   Please Advise.

## 2018-02-06 NOTE — Telephone Encounter (Signed)
Patient wife instructed to give patient insulin so BS will go down. She verbalized understanding and asked for refill on his hydrocortisone rectal cream for his hemoroids. Refilled and sent to [facility] as requested.

## 2018-02-11 ENCOUNTER — Telehealth: Payer: Self-pay

## 2018-02-11 NOTE — Telephone Encounter (Signed)
Anderson Malta from [facility] called stating she seen patient yesterday for pick line care. Stated she was unsuccessful when trying to draw labs on patient. Informed him to go to the lab to have these done. Just called our office to inform us.

## 2018-02-17 ENCOUNTER — Telehealth: Payer: Self-pay

## 2018-02-17 NOTE — Telephone Encounter (Signed)
(   CONT FROM PREVIOUS TELEPHONE MESSAGE) Accidentally closed it...  Spoke with patients wife and informed to increase insulin dose to 10 units daily and clal back in 2 weeks to report BS reading.   Also, informed her the ordering doctor for home health will need to order labs since Dr Army Melia is unsure of what needs to be ordered. Patient's wife stated its "Dr Posey Pronto" and she will call him about lab orders.

## 2018-02-17 NOTE — Telephone Encounter (Signed)
Increase insulin dose to 10 units per day. Call back in 2 weeks to report blood sugar readings. I do not know what labs are requested.  The MD who wants them can always order them at [employer] [facility] - it does not have to be me.

## 2018-02-17 NOTE — Telephone Encounter (Signed)
Patient's wife called stating patient BS is still too high. Running in the high 100s to over 200 the majority of the time. She is giving him insulin but BS have never been this high. Please Advise.  Also, Stated last week home health nurse was unable to do his lab orders. She wanted to know if we could order his labs this week to have them drawn here?

## 2018-02-18 ENCOUNTER — Other Ambulatory Visit: Payer: Self-pay | Admitting: Internal Medicine

## 2018-02-18 ENCOUNTER — Telehealth: Payer: Self-pay

## 2018-02-18 DIAGNOSIS — E113553 Type 2 diabetes mellitus with stable proliferative diabetic retinopathy, bilateral: Secondary | ICD-10-CM

## 2018-02-18 MED ORDER — PEN NEEDLES 32G X 5 MM MISC: 1.0000 | Freq: Every day | 3 refills | Status: AC

## 2018-02-18 MED ORDER — BASAGLAR KWIKPEN 100 UNIT/ML ~~LOC~~ SOPN: 10.0000 [IU] | PEN_INJECTOR | Freq: Every day | SUBCUTANEOUS | 1 refills | Status: AC

## 2018-02-18 NOTE — Telephone Encounter (Signed)
Patient wife called stating she needs new insulin called in with the supplies needed for It to [facility] in [place]. Stated he is doing the 10 units now since we upped his dose as of yesterday.  Please Advise.

## 2018-02-18 NOTE — Telephone Encounter (Signed)
The sample pen I gave him has enough insulin for 10 units a day for 30 days.  She should have at least 2 more weeks worth but I will go ahead and send it in, in case we have to get approval.

## 2018-02-18 NOTE — Telephone Encounter (Signed)
Patient wife informed

## 2018-03-28 ENCOUNTER — Ambulatory Visit (INDEPENDENT_AMBULATORY_CARE_PROVIDER_SITE_OTHER): Payer: Medicare Other | Admitting: Internal Medicine

## 2018-03-28 ENCOUNTER — Encounter: Payer: Self-pay | Admitting: Internal Medicine

## 2018-03-28 VITALS — BP 98/62 | HR 74 | Temp 97.6°F | Resp 16 | Ht 74.0 in | Wt 165.0 lb

## 2018-03-28 DIAGNOSIS — E113553 Type 2 diabetes mellitus with stable proliferative diabetic retinopathy, bilateral: Secondary | ICD-10-CM | POA: Diagnosis not present

## 2018-03-28 DIAGNOSIS — J0100 Acute maxillary sinusitis, unspecified: Secondary | ICD-10-CM | POA: Diagnosis not present

## 2018-03-28 DIAGNOSIS — R634 Abnormal weight loss: Secondary | ICD-10-CM

## 2018-03-28 DIAGNOSIS — F5101 Primary insomnia: Secondary | ICD-10-CM

## 2018-03-28 LAB — POCT URINALYSIS DIPSTICK
BILIRUBIN UA: NEGATIVE
Blood, UA: NEGATIVE
Glucose, UA: NEGATIVE
KETONES UA: NEGATIVE
Nitrite, UA: NEGATIVE
PH UA: 6 (ref 5.0–8.0)
Protein, UA: NEGATIVE
Spec Grav, UA: 1.02 (ref 1.010–1.025)
UROBILINOGEN UA: 0.2 U/dL

## 2018-03-28 MED ORDER — PREDNISONE 10 MG PO TABS
ORAL_TABLET | ORAL | 0 refills | Status: AC
Start: 2018-03-28 — End: 2018-04-03

## 2018-03-28 MED ORDER — MIRTAZAPINE 15 MG PO TABS: 15.0000 mg | ORAL_TABLET | Freq: Every day | ORAL | 5 refills | Status: AC

## 2018-03-28 MED ORDER — DOXYCYCLINE HYCLATE 100 MG PO TABS: 100.0000 mg | ORAL_TABLET | Freq: Two times a day (BID) | ORAL | 0 refills | Status: AC

## 2018-03-28 NOTE — Patient Instructions (Signed)
Ask home health if they can do A1C at next blood draw.  Hold Mirtazepine until he is finished with Doxycycline and Prednisone.

## 2018-03-28 NOTE — Progress Notes (Signed)
Date:  03/28/2018   Name:  Terrance Carrillo   DOB:  10/05/1934   MRN:  [id number]   Chief Complaint: Urinary Tract Infection (odor/dark ) and Sinusitis (1 week ) Urinary Tract Infection   This is a new (dark urine with odor) problem. Associated symptoms include chills (and fatigue). He has tried increased fluids for the symptoms.  Sinusitis  This is a new problem. The current episode started 1 to 4 weeks ago. The problem is unchanged. There has been no fever. The pain is mild. Associated symptoms include chills (and fatigue), congestion, headaches, sinus pressure and a sore throat. Pertinent negatives include no coughing or ear pain. Past treatments include spray decongestants (and allergy medication).  Diabetes  He presents for his follow-up diabetic visit. He has type 2 diabetes mellitus. Hypoglycemia symptoms include headaches. Pertinent negatives for hypoglycemia include no dizziness or nervousness/anxiousness. Associated symptoms include fatigue. Pertinent negatives for diabetes include no chest pain. Current diabetic treatment includes insulin injections. He is compliant with treatment all of the time. His weight is decreasing steadily. He monitors blood glucose at home 1-2 x per day. His home blood glucose trend is decreasing steadily. His breakfast blood glucose is taken between 7-8 am. His breakfast blood glucose range is generally 110-130 mg/dl.   Weight loss - has lost 20 lbs over the past 6 months.  Appetite is down, no choking or abdominal pain.  Gets fatigued easily.    Wt Readings from Last 3 Encounters:  03/28/18 165 lb (74.8 kg)  01/01/18 177 lb (80.3 kg)  10/18/17 181 lb (82.1 kg)     Review of Systems  Constitutional: Positive for chills (and fatigue) and fatigue. Negative for appetite change and unexpected weight change.  HENT: Positive for congestion, postnasal drip, sinus pressure and sore throat. Negative for ear pain and trouble swallowing.   Respiratory: Negative for  cough, chest tightness and wheezing.   Cardiovascular: Negative for chest pain.  Gastrointestinal: Negative for abdominal pain.  Genitourinary: Positive for decreased urine volume. Negative for dysuria.  Neurological: Positive for headaches. Negative for dizziness, syncope and light-headedness.  Psychiatric/Behavioral: Positive for sleep disturbance. Negative for dysphoric mood. The patient is not nervous/anxious.     Patient Active Problem List   Diagnosis Date Noted  . Chronic idiopathic constipation 06/18/2017  . Chronic lymphocytic leukemia of B-cell type not having achieved remission ([place]) 05/20/2017  . Anemia, unspecified 05/20/2017  . Nonproliferative diabetic retinopathy ([place]) 04/12/2017  . Claudication of lower extremity ([place]) 09/06/2016  . BPH with obstruction/lower urinary tract symptoms 01/02/2016  . Recurrent UTI 01/02/2016  . Hiatal hernia 12/09/2015  . Irritable bowel syndrome (IBS) 12/09/2015  . Essential hypertension 12/09/2015  . Idiopathic insomnia 06/30/2015  . Local edema 06/30/2015  . Cardiomyopathy, ischemic 06/30/2015  . History of prolonged Q-T interval on ECG 06/30/2015  . Acid reflux 06/30/2015  . Environmental and seasonal allergies 06/30/2015  . Dyslipidemia 06/30/2015  . Chronic diastolic heart failure ([place]) 06/30/2015  . Acquired hypothyroidism 06/30/2015  . Other long term (current) drug therapy 06/05/2012  . MI (mitral incompetence) 01/24/2012  . Type 2 diabetes mellitus with retinopathy ([place]) 01/24/2012  . Paroxysmal ventricular tachycardia ([place]) 08/23/2011  . Arteriosclerosis of coronary artery 08/23/2011  . Automatic implantable cardioverter-defibrillator in situ 08/16/2011    Prior to Admission medications   Medication Sig Start Date End Date Taking? Authorizing Provider  aspirin 81 MG tablet Take by mouth. 11/16/10  Yes [provider]  DOBUTAMINE HCL IV Inject  into the vein every other day.   Yes [provider]    hydrocortisone (ANUSOL-HC) 2.5 % rectal cream Place 1 application rectally 2 (two) times daily. 02/06/18  Yes Glean Hess, MD  Insulin Glargine (BASAGLAR KWIKPEN) 100 UNIT/ML SOPN Inject 0.1-0.3 mLs (10-30 Units total) into the skin daily. Begin 10 units daily - do not increase dose until instructed by MD 02/18/18  Yes Glean Hess, MD  Insulin Pen Needle (PEN NEEDLES) 32G X 5 MM MISC 1 each by Does not apply route daily. 02/18/18  Yes Glean Hess, MD  levothyroxine (SYNTHROID, LEVOTHROID) 25 MCG tablet TAKE 1 TABLET ON AN EMPTY STOMACH WITH A GLASS OF WATER AT LEAST 30 TO 60 MINUTES BEFORE BREAKFAST. 12/17/17  Yes Glean Hess, MD  magnesium oxide (MAG-OX) 400 MG tablet Take 400 mg by mouth daily. 01/22/17  Yes [provider]  Multiple Vitamin (MULTI-VITAMINS) TABS Take by mouth. Reported on 01/16/2016   Yes [provider]  potassium chloride SA (K-DUR,KLOR-CON) 20 MEQ tablet Take 20 mEq by mouth 2 (two) times daily.   Yes [provider]  spironolactone (ALDACTONE) 25 MG tablet Take 0.5 tablets (12.5 mg total) by mouth daily. 12/23/17  Yes Glean Hess, MD  torsemide (DEMADEX) 20 MG tablet Take 60 mg in the AM and 40 mg in the PM daily. 02/05/18  Yes Glean Hess, MD  metolazone (ZAROXOLYN) 2.5 MG tablet Take 2.5 mg by mouth as needed.    [provider]    Allergies  Allergen Reactions  . Penicillins Rash  . Sulfa Antibiotics Rash    Full body rash, itching, face turns bright red    Past Surgical History:  Procedure Laterality Date  . CARDIAC DEFIBRILLATOR PLACEMENT  2014   PSVT  . CATARACT EXTRACTION, BILATERAL    . CORONARY ARTERY BYPASS GRAFT  2011   x3    Social History   Tobacco Use  . Smoking status: Never Smoker  . Smokeless tobacco: Never Used  Substance Use Topics  . Alcohol use: No    Alcohol/week: 0.0 oz  . Drug use: No     Medication list has been reviewed and updated.  Current Meds  Medication Sig   . aspirin 81 MG tablet Take by mouth.  . DOBUTAMINE HCL IV Inject into the vein every other day.  . hydrocortisone (ANUSOL-HC) 2.5 % rectal cream Place 1 application rectally 2 (two) times daily.  . Insulin Glargine (BASAGLAR KWIKPEN) 100 UNIT/ML SOPN Inject 0.1-0.3 mLs (10-30 Units total) into the skin daily. Begin 10 units daily - do not increase dose until instructed by MD  . Insulin Pen Needle (PEN NEEDLES) 32G X 5 MM MISC 1 each by Does not apply route daily.  Marland Kitchen levothyroxine (SYNTHROID, LEVOTHROID) 25 MCG tablet TAKE 1 TABLET ON AN EMPTY STOMACH WITH A GLASS OF WATER AT LEAST 30 TO 60 MINUTES BEFORE BREAKFAST.  . magnesium oxide (MAG-OX) 400 MG tablet Take 400 mg by mouth daily.  . Multiple Vitamin (MULTI-VITAMINS) TABS Take by mouth. Reported on 01/16/2016  . potassium chloride SA (K-DUR,KLOR-CON) 20 MEQ tablet Take 20 mEq by mouth 2 (two) times daily.  Marland Kitchen spironolactone (ALDACTONE) 25 MG tablet Take 0.5 tablets (12.5 mg total) by mouth daily.  Marland Kitchen torsemide (DEMADEX) 20 MG tablet Take 60 mg in the AM and 40 mg in the PM daily.    PHQ 2/9 Scores 08/12/2017 08/08/2016 07/08/2015  PHQ - 2 Score 0 0 0  Physical Exam  Constitutional: He is oriented to person, place, and time. He appears well-developed. No distress.  HENT:  Head: Normocephalic and atraumatic.  Right Ear: Tympanic membrane is not erythematous and not retracted.  Left Ear: Tympanic membrane is not erythematous and not retracted.  Nose: Right sinus exhibits maxillary sinus tenderness. Left sinus exhibits maxillary sinus tenderness.  Mouth/Throat: Posterior oropharyngeal erythema present. No oropharyngeal exudate or posterior oropharyngeal edema.  Cardiovascular: Normal rate and regular rhythm. Exam reveals distant heart sounds.  Pulmonary/Chest: Effort normal. No respiratory distress. He has decreased breath sounds. He has no wheezes. He has no rhonchi.  Musculoskeletal: Normal range of motion.  Neurological: He is alert and  oriented to person, place, and time.  Skin: Skin is warm and dry. No rash noted.  Psychiatric: His speech is normal and behavior is normal. Thought content normal. He exhibits a depressed mood.  Nursing note and vitals reviewed.   BP 98/62   Pulse 74   Temp 97.6 F (36.4 C)   Resp 16   Ht 6\' 2"  (1.88 m)   Wt 165 lb (74.8 kg)   SpO2 97%   BMI 21.18 kg/m   Assessment and Plan: 1. Acute non-recurrent maxillary sinusitis Short prednisone taper and Doxycyline Continue flonase and xyzal  2. Type 2 diabetes mellitus with stable proliferative retinopathy of both eyes, without long-term current use of insulin (HCC) Continue insulin Ask HH to draw A1C next visit  3. Idiopathic insomnia Will try mirtazapine to help with sleep and appetite - mirtazapine (REMERON) 15 MG tablet; Take 1 tablet (15 mg total) by mouth at bedtime.  Dispense: 30 tablet; Refill: 5  4. Weight loss, non-intentional Monitor closely UA negative - encourage adequate fluids daily   Meds ordered this encounter  Medications  . doxycycline (VIBRA-TABS) 100 MG tablet    Sig: Take 1 tablet (100 mg total) by mouth 2 (two) times daily for 10 days.    Dispense:  20 tablet    Refill:  0  . predniSONE (DELTASONE) 10 MG tablet    Sig: 2-2-2-1-1-1    Dispense:  9 tablet    Refill:  0  . mirtazapine (REMERON) 15 MG tablet    Sig: Take 1 tablet (15 mg total) by mouth at bedtime.    Dispense:  30 tablet    Refill:  5    Partially dictated using Editor, commissioning. Any errors are unintentional.  Halina Maidens, MD [facility]  03/28/2018

## 2018-04-07 LAB — HEMOGLOBIN A1C: HEMOGLOBIN A1C: 9.9

## 2018-04-08 ENCOUNTER — Telehealth: Payer: Self-pay

## 2018-04-08 NOTE — Telephone Encounter (Signed)
Please call wife and instruct her to increase the insulin dose to 15 units per day.

## 2018-04-08 NOTE — Telephone Encounter (Signed)
[facility] nurse calling to report patient on Dobutamine and [place] D5W causing BS elevation and A1C of 9.9. She wants to know if insulin needs adjusted. Started the IV in march and that os when BS elevation started.  [phone] VM OK

## 2018-04-08 NOTE — Telephone Encounter (Signed)
Ask them what the rate of infusion is?  And can it be put in 1/2 normal saline instead?   I will also need to increase the insulin dose.

## 2018-04-09 NOTE — Telephone Encounter (Signed)
Estill Bamberg from [facility] called and stated pharmacy will change Dobutamine to NS drip. Patient will get the NS and orders will be placed.

## 2018-04-09 NOTE — Telephone Encounter (Signed)
Spoke with Mrs. Laban and she will increase insulin to 15 units

## 2018-04-09 NOTE — Telephone Encounter (Signed)
Spoke with patient [facility] nurse and she stated he is at 6 mL per hour on infusion rare and she is contacting pharmacy regarding 1/2/ normal saline. [facility] nurse will call back regarding NS

## 2018-04-15 LAB — BASIC METABOLIC PANEL
BUN: 58 — AB (ref 4–21)
Creatinine: 3 — AB (ref 0.6–1.3)

## 2018-04-15 LAB — CBC AND DIFFERENTIAL
HCT: 30 — AB (ref 41–53)
HEMOGLOBIN: 9.5 — AB (ref 13.5–17.5)
WBC: 5308

## 2018-04-16 ENCOUNTER — Ambulatory Visit (INDEPENDENT_AMBULATORY_CARE_PROVIDER_SITE_OTHER): Payer: Medicare Other | Admitting: Internal Medicine

## 2018-04-16 ENCOUNTER — Encounter: Payer: Self-pay | Admitting: Internal Medicine

## 2018-04-16 VITALS — BP 100/56 | HR 77 | Temp 97.4°F | Resp 16 | Ht 74.0 in | Wt 150.0 lb

## 2018-04-16 DIAGNOSIS — E113553 Type 2 diabetes mellitus with stable proliferative diabetic retinopathy, bilateral: Secondary | ICD-10-CM | POA: Diagnosis not present

## 2018-04-16 DIAGNOSIS — I5032 Chronic diastolic (congestive) heart failure: Secondary | ICD-10-CM | POA: Diagnosis not present

## 2018-04-16 DIAGNOSIS — C9110 Chronic lymphocytic leukemia of B-cell type not having achieved remission: Secondary | ICD-10-CM | POA: Diagnosis not present

## 2018-04-16 NOTE — Progress Notes (Signed)
Date:  04/16/2018   Name:  Terrance Carrillo   DOB:  02-11-1934   MRN:  [id number]   Chief Complaint: Diabetes Diabetes  He presents for his follow-up diabetic visit. He has type 2 diabetes mellitus. Pertinent negatives for hypoglycemia include no dizziness or headaches. Pertinent negatives for diabetes include no chest pain and no fatigue. Symptoms are improving. Current diabetic treatment includes insulin injections. He is compliant with treatment all of the time.  since increasing insulin to 15 units his morning BS are in the 110 -130 range, down from > 160.  CHF -  Still on dobutamine via PICC line - looking into changing from D5W to NS.  He has stopped the lasix completely due to dehydration. Last BUN higher, GFR 19.  CLL - counts have been stable with stable chronic anemia.  He continues to be fatigued, but no abnormal bleeding.  Seen by oncology 6 mnths ago.  Have been getting CBCs weekly.  Lab Results  Component Value Date   HGBA1C 9.9 04/07/2018   Lab Results  Component Value Date   CREATININE 3.0 (A) 04/15/2018   BUN 58 (A) 04/15/2018   NA 137 02/04/2018   K 4.4 02/04/2018   CL 92 (L) 02/04/2018   CO2 27 02/04/2018   Lab Results  Component Value Date   WBC 5,308.0 04/15/2018   HGB 9.5 (A) 04/15/2018   HCT 30 (A) 04/15/2018   MCV 99 (H) 02/04/2018   PLT 176 02/04/2018     Review of Systems  Constitutional: Positive for appetite change. Negative for fatigue and unexpected weight change.  Respiratory: Negative for cough, chest tightness, shortness of breath and wheezing.   Cardiovascular: Negative for chest pain, palpitations and leg swelling.  Gastrointestinal: Negative for abdominal pain.  Neurological: Negative for dizziness and headaches.  Psychiatric/Behavioral: Negative for sleep disturbance.    Patient Active Problem List   Diagnosis Date Noted  . Chronic idiopathic constipation 06/18/2017  . 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 Anemia, unspecified 05/20/2017  . Nonproliferative diabetic retinopathy ([place]) 04/12/2017  . Claudication of lower extremity ([place]) 09/06/2016  . BPH with obstruction/lower urinary tract symptoms 01/02/2016  . Recurrent UTI 01/02/2016  . Hiatal hernia 12/09/2015  . Irritable bowel syndrome (IBS) 12/09/2015  . Essential hypertension 12/09/2015  . Idiopathic insomnia 06/30/2015  . Local edema 06/30/2015  . Cardiomyopathy, ischemic 06/30/2015  . History of prolonged Q-T interval on ECG 06/30/2015  . Acid reflux 06/30/2015  . Environmental and seasonal allergies 06/30/2015  . Dyslipidemia 06/30/2015  . Chronic diastolic heart failure ([place]) 06/30/2015  . Acquired hypothyroidism 06/30/2015  . Other long term (current) drug therapy 06/05/2012  . MI (mitral incompetence) 01/24/2012  . Type 2 diabetes mellitus with retinopathy ([place]) 01/24/2012  . Paroxysmal ventricular tachycardia ([place]) 08/23/2011  . Arteriosclerosis of coronary artery 08/23/2011  . Automatic implantable cardioverter-defibrillator in situ 08/16/2011    Prior to Admission medications   Medication Sig Start Date End Date Taking? Authorizing Provider  amiodarone (PACERONE) 200 MG tablet Take 200 mg by mouth daily.    Yes [provider]  aspirin 81 MG tablet Take by mouth. 11/16/10  Yes [provider]  cetirizine (ZYRTEC) 10 MG tablet Take 10 mg by mouth daily.   Yes [provider]  DOBUTAMINE HCL IV Inject into the vein every other day.   Yes [provider]  hydrocortisone (ANUSOL-HC) 2.5 % rectal cream Place 1 application rectally  2 (two) times daily. 02/06/18  Yes Glean Hess, MD  Insulin Glargine (BASAGLAR KWIKPEN) 100 UNIT/ML SOPN Inject 0.1-0.3 mLs (10-30 Units total) into the skin daily. Begin 10 units daily - do not increase dose until instructed by MD Patient taking differently: Inject 15 Units into the skin daily. Begin 15 units daily - do not  increase dose until instructed by MD 02/18/18  Yes Glean Hess, MD  Insulin Pen Needle (PEN NEEDLES) 32G X 5 MM MISC 1 each by Does not apply route daily. 02/18/18  Yes Glean Hess, MD  levothyroxine (SYNTHROID, LEVOTHROID) 25 MCG tablet TAKE 1 TABLET ON AN EMPTY STOMACH WITH A GLASS OF WATER AT LEAST 30 TO 60 MINUTES BEFORE BREAKFAST. 12/17/17  Yes Glean Hess, MD  magnesium oxide (MAG-OX) 400 MG tablet Take 400 mg by mouth daily. 01/22/17  Yes [provider]  Multiple Vitamin (MULTI-VITAMINS) TABS Take by mouth. Reported on 01/16/2016   Yes [provider]  potassium chloride SA (K-DUR,KLOR-CON) 20 MEQ tablet Take 20 mEq by mouth 2 (two) times daily.   Yes [provider]  spironolactone (ALDACTONE) 25 MG tablet Take 0.5 tablets (12.5 mg total) by mouth daily. 12/23/17  Yes Glean Hess, MD  metolazone (ZAROXOLYN) 2.5 MG tablet Take 2.5 mg by mouth as needed.    [provider]  mirtazapine (REMERON) 15 MG tablet Take 1 tablet (15 mg total) by mouth at bedtime. Patient not taking: Reported on 04/16/2018 03/28/18   Glean Hess, MD    Allergies  Allergen Reactions  . Penicillins Rash  . Sulfa Antibiotics Rash    Full body rash, itching, face turns bright red    Past Surgical History:  Procedure Laterality Date  . CARDIAC DEFIBRILLATOR PLACEMENT  2014   PSVT  . CATARACT EXTRACTION, BILATERAL    . CORONARY ARTERY BYPASS GRAFT  2011   x3    Social History   Tobacco Use  . Smoking status: Never Smoker  . Smokeless tobacco: Never Used  Substance Use Topics  . Alcohol use: No    Alcohol/week: 0.0 oz  . Drug use: No     Medication list has been reviewed and updated.  Current Meds  Medication Sig  . amiodarone (PACERONE) 200 MG tablet Take 200 mg by mouth daily.   Marland Kitchen aspirin 81 MG tablet Take by mouth.  . cetirizine (ZYRTEC) 10 MG tablet Take 10 mg by mouth daily.  . DOBUTAMINE HCL IV Inject into the vein every other day.   . hydrocortisone (ANUSOL-HC) 2.5 % rectal cream Place 1 application rectally 2 (two) times daily.  . Insulin Glargine (BASAGLAR KWIKPEN) 100 UNIT/ML SOPN Inject 0.1-0.3 mLs (10-30 Units total) into the skin daily. Begin 10 units daily - do not increase dose until instructed by MD (Patient taking differently: Inject 15 Units into the skin daily. Begin 15 units daily - do not increase dose until instructed by MD)  . Insulin Pen Needle (PEN NEEDLES) 32G X 5 MM MISC 1 each by Does not apply route daily.  Marland Kitchen levothyroxine (SYNTHROID, LEVOTHROID) 25 MCG tablet TAKE 1 TABLET ON AN EMPTY STOMACH WITH A GLASS OF WATER AT LEAST 30 TO 60 MINUTES BEFORE BREAKFAST.  . magnesium oxide (MAG-OX) 400 MG tablet Take 400 mg by mouth daily.  . Multiple Vitamin (MULTI-VITAMINS) TABS Take by mouth. Reported on 01/16/2016  . potassium chloride SA (K-DUR,KLOR-CON) 20 MEQ tablet Take 20 mEq by mouth 2 (two) times daily.  Marland Kitchen spironolactone (  ALDACTONE) 25 MG tablet Take 0.5 tablets (12.5 mg total) by mouth daily.    PHQ 2/9 Scores 08/12/2017 08/08/2016 07/08/2015  PHQ - 2 Score 0 0 0    Physical Exam  Constitutional: He is oriented to person, place, and time. He appears cachectic. No distress.  HENT:  Head: Normocephalic and atraumatic.  Cardiovascular: Regular rhythm. Bradycardia present. Exam reveals distant heart sounds.  Pulmonary/Chest: Effort normal. No accessory muscle usage or stridor. No respiratory distress. He has no wheezes. He has rales in the left lower field.  Musculoskeletal: Normal range of motion.  Neurological: He is alert and oriented to person, place, and time.  Skin: Skin is warm and dry. No rash noted.  Psychiatric: He has a normal mood and affect. His speech is normal and behavior is normal. Thought content normal.  Nursing note and vitals reviewed.   BP (!) 100/56   Pulse 77   Temp (!) 97.4 F (36.3 C) (Oral)   Resp 16   Ht 6\' 2"  (1.88 m)   Wt 150 lb (68 kg)   SpO2 99%   BMI 19.26 kg/m     Assessment and Plan: 1. Type 2 diabetes mellitus with stable proliferative retinopathy of both eyes, without long-term current use of insulin (HCC) Schedule annual eye exam Continue insulin 15 units daily Recheck A1C in 3 months when [facility] visits for weekly labs  2. Chronic diastolic heart failure (HCC) Recently stopped lasix Continues dobutamine infusion  3. Chronic lymphocytic leukemia of B-cell type not having achieved remission (HCC) Recent CBC stable 58K with no change in diff   No orders of the defined types were placed in this encounter.   Partially dictated using Editor, commissioning. Any errors are unintentional.  Halina Maidens, MD [facility]  04/16/2018   There are no diagnoses linked to this encounter.

## 2018-04-24 ENCOUNTER — Other Ambulatory Visit: Payer: Self-pay | Admitting: Internal Medicine

## 2018-06-21 IMAGING — CR DG ABDOMEN 1V
1 series · 2 of 2 positions shown · non-contrast
Comparison: 05/11/2010

CLINICAL DATA: History of kidney stones

EXAM:
ABDOMEN - 1 VIEW

[Series 1: dg abd 1 view · 0.14mm/px · 2 of 2 slices shown]
[im 1/2]
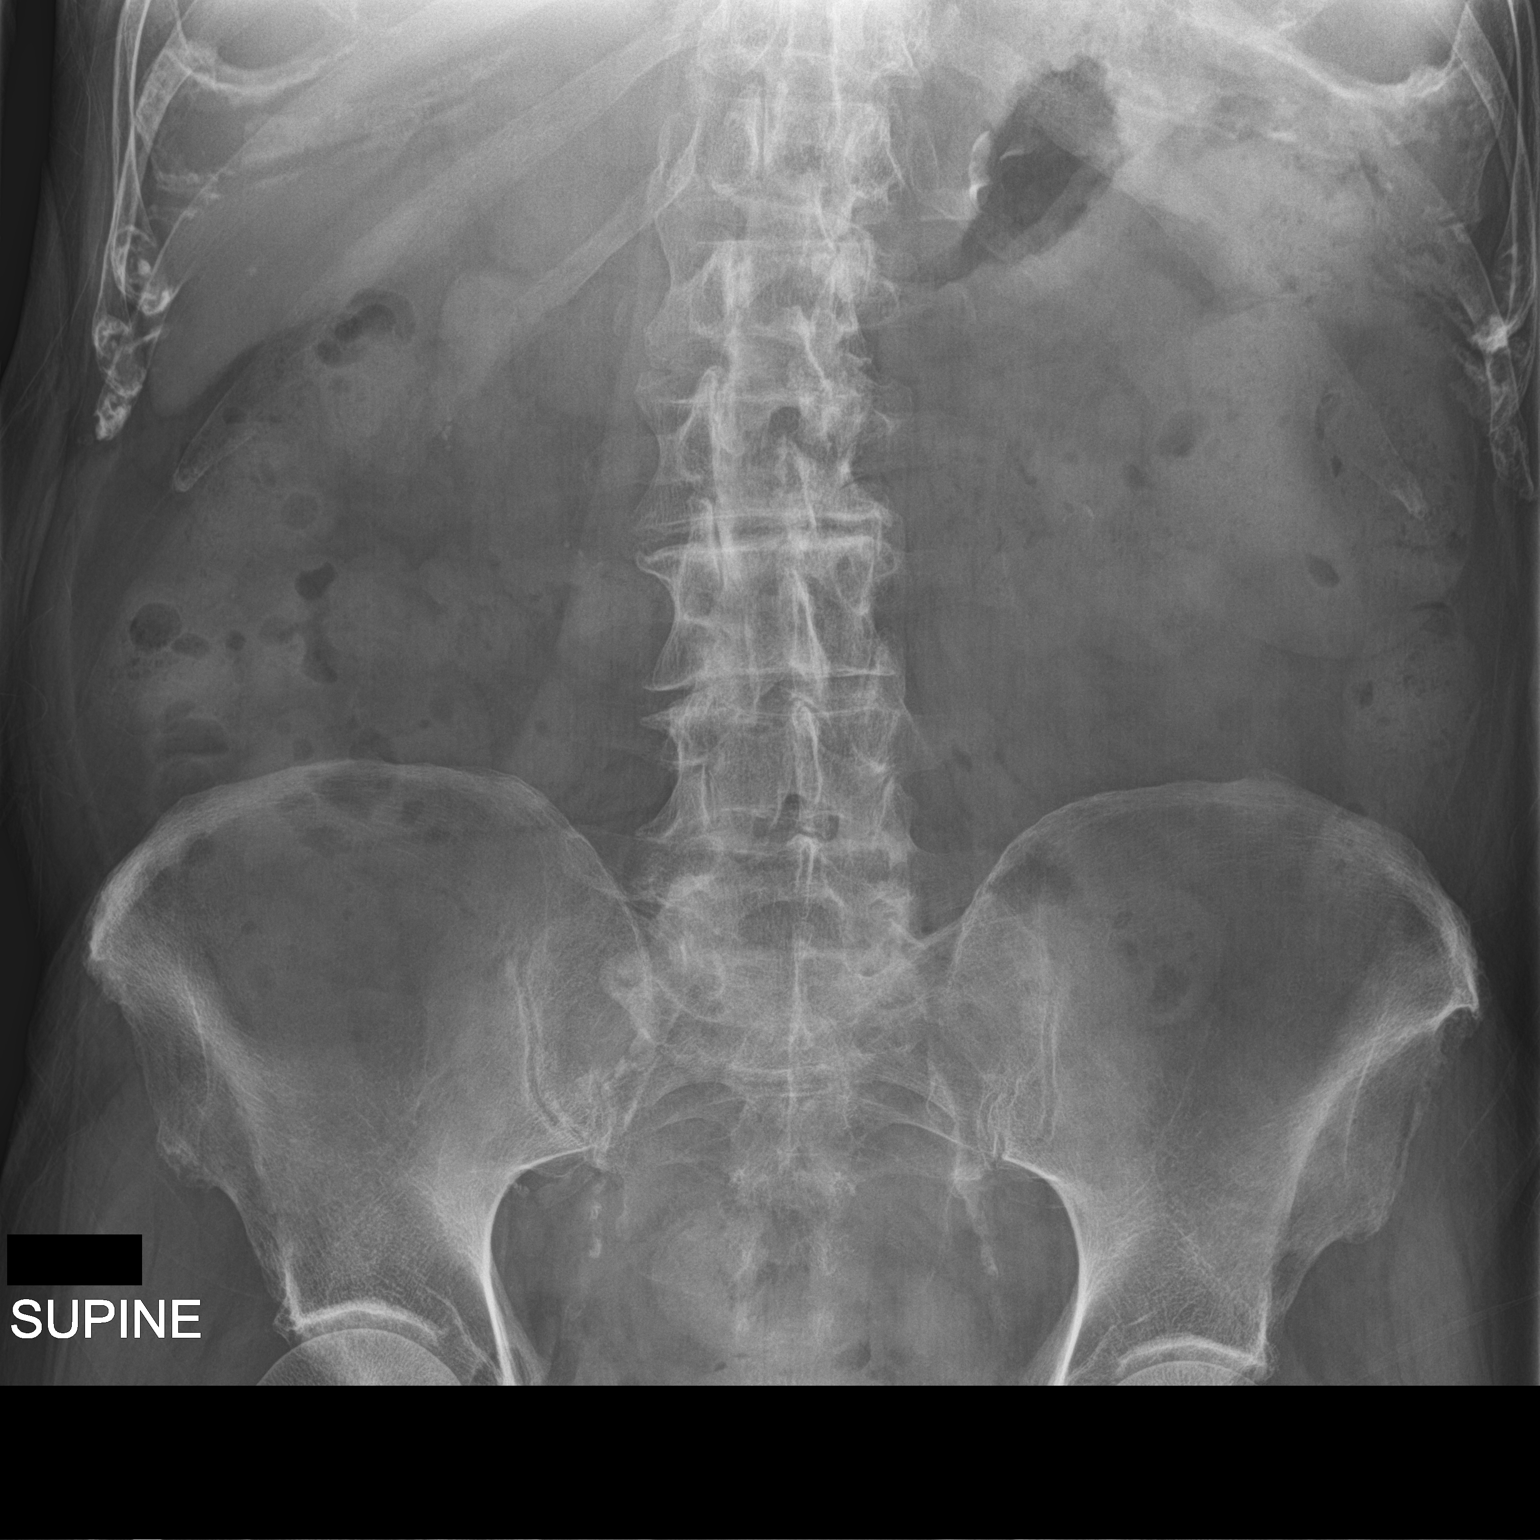
[im 2/2]
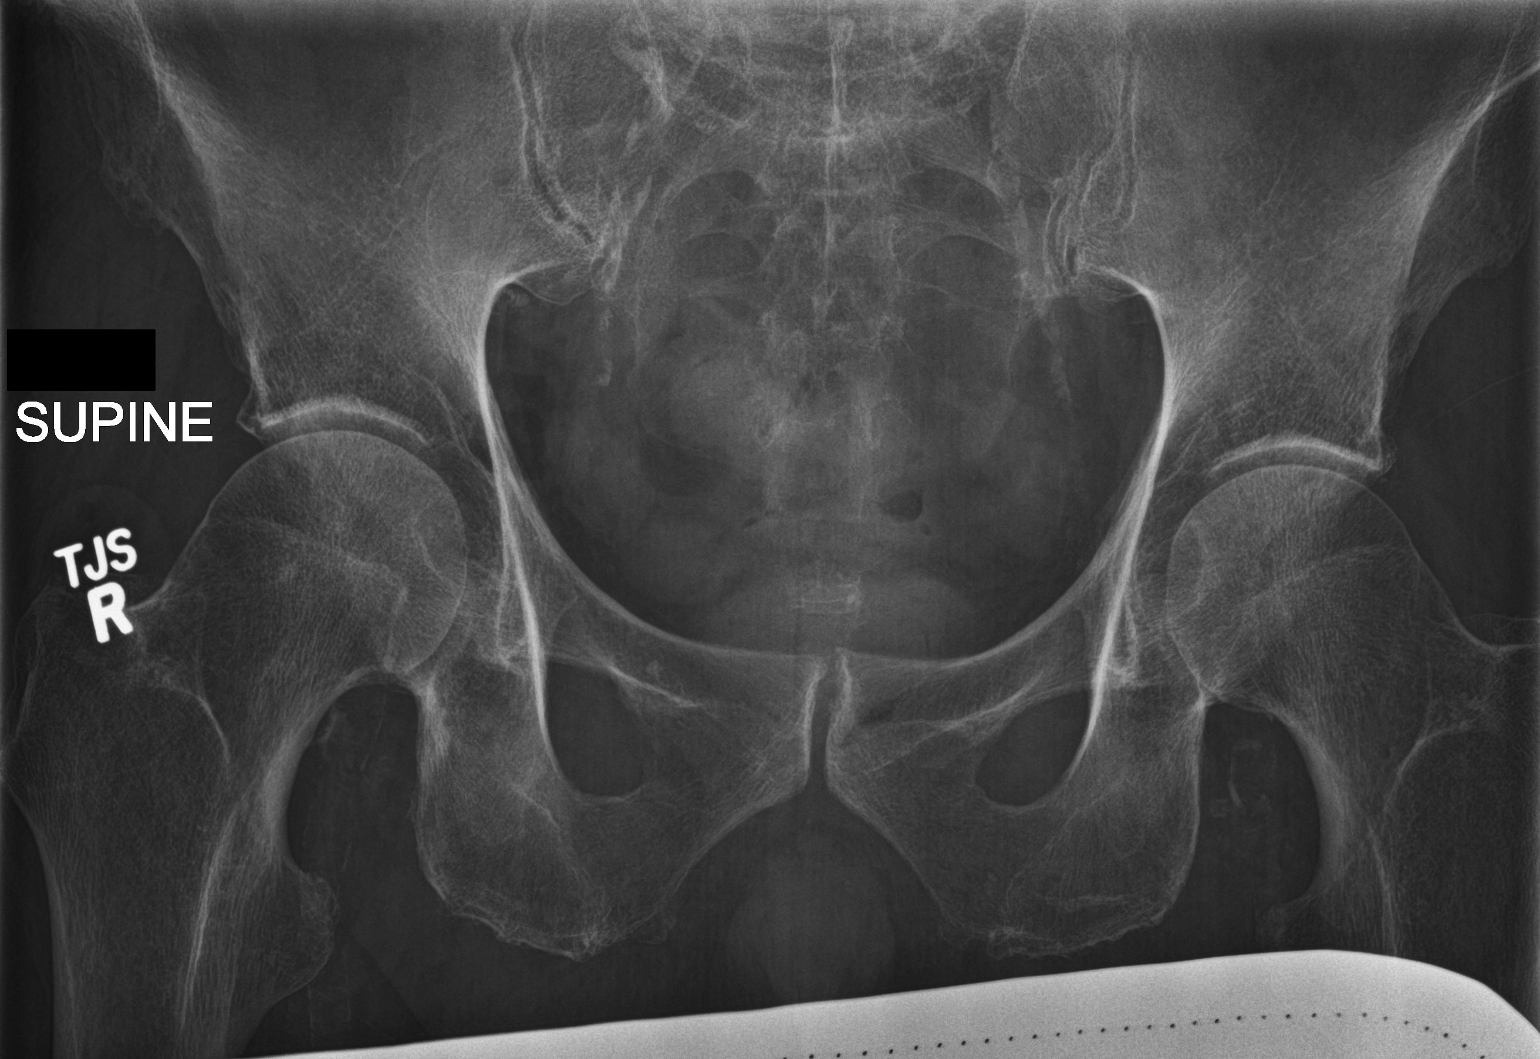

[2 of 2 positions shown; findings below may reference images not displayed]

FINDINGS: Scattered large and small bowel gas is noted. There are tiny
densities identified projecting more in the course of the transverse
colon likely related to ingested material. No definitive renal or
ureteral calculi are seen. Vascular calcifications are noted as
well.
IMPRESSION: No definitive renal calculi are seen.

## 2018-07-06 DEATH — deceased

## 2018-07-21 ENCOUNTER — Ambulatory Visit: Payer: Medicare Other | Admitting: Internal Medicine
